# Patient Record
Sex: Male | Born: 1970 | Race: Black or African American | Hispanic: No | Marital: Married | Smoking: Former smoker
Health system: Southern US, Community
[De-identification: ages and names within clinical notes are randomized; demographics above are authoritative.]

## PROBLEM LIST (undated history)

## (undated) DIAGNOSIS — F319 Bipolar disorder, unspecified: Secondary | ICD-10-CM

## (undated) DIAGNOSIS — F32A Depression, unspecified: Secondary | ICD-10-CM

## (undated) DIAGNOSIS — F1010 Alcohol abuse, uncomplicated: Secondary | ICD-10-CM

## (undated) DIAGNOSIS — F329 Major depressive disorder, single episode, unspecified: Secondary | ICD-10-CM

## (undated) DIAGNOSIS — Z9289 Personal history of other medical treatment: Secondary | ICD-10-CM

## (undated) DIAGNOSIS — N529 Male erectile dysfunction, unspecified: Secondary | ICD-10-CM

## (undated) DIAGNOSIS — B001 Herpesviral vesicular dermatitis: Secondary | ICD-10-CM

## (undated) HISTORY — DX: Alcohol abuse, uncomplicated: F10.10

## (undated) HISTORY — DX: Herpesviral vesicular dermatitis: B00.1

## (undated) HISTORY — DX: Bipolar disorder, unspecified: F31.9

## (undated) HISTORY — PX: TENDON REPAIR: SHX5111

## (undated) HISTORY — DX: Male erectile dysfunction, unspecified: N52.9

## (undated) HISTORY — DX: Major depressive disorder, single episode, unspecified: F32.9

## (undated) HISTORY — DX: Personal history of other medical treatment: Z92.89

## (undated) HISTORY — DX: Depression, unspecified: F32.A

---

## 1999-02-09 ENCOUNTER — Emergency Department (HOSPITAL_COMMUNITY): Admission: EM | Admit: 1999-02-09 | Discharge: 1999-02-09 | Payer: Self-pay | Admitting: Emergency Medicine

## 1999-02-11 ENCOUNTER — Emergency Department (HOSPITAL_COMMUNITY): Admission: EM | Admit: 1999-02-11 | Discharge: 1999-02-11 | Payer: Self-pay | Admitting: Emergency Medicine

## 1999-10-02 ENCOUNTER — Emergency Department (HOSPITAL_COMMUNITY): Admission: EM | Admit: 1999-10-02 | Discharge: 1999-10-02 | Payer: Self-pay | Admitting: Emergency Medicine

## 2001-05-23 ENCOUNTER — Emergency Department (HOSPITAL_COMMUNITY): Admission: EM | Admit: 2001-05-23 | Discharge: 2001-05-23 | Payer: Self-pay | Admitting: Emergency Medicine

## 2001-09-20 ENCOUNTER — Encounter: Payer: Self-pay | Admitting: Family Medicine

## 2001-09-20 ENCOUNTER — Ambulatory Visit (HOSPITAL_COMMUNITY): Admission: RE | Admit: 2001-09-20 | Discharge: 2001-09-20 | Payer: Self-pay | Admitting: Family Medicine

## 2002-02-11 ENCOUNTER — Inpatient Hospital Stay (HOSPITAL_COMMUNITY): Admission: EM | Admit: 2002-02-11 | Discharge: 2002-02-17 | Payer: Self-pay | Admitting: Psychiatry

## 2003-05-20 ENCOUNTER — Encounter: Payer: Self-pay | Admitting: Emergency Medicine

## 2003-05-20 ENCOUNTER — Emergency Department (HOSPITAL_COMMUNITY): Admission: EM | Admit: 2003-05-20 | Discharge: 2003-05-20 | Payer: Self-pay | Admitting: Emergency Medicine

## 2003-08-01 ENCOUNTER — Inpatient Hospital Stay (HOSPITAL_COMMUNITY): Admission: EM | Admit: 2003-08-01 | Discharge: 2003-08-03 | Payer: Self-pay | Admitting: Psychiatry

## 2003-08-05 ENCOUNTER — Emergency Department (HOSPITAL_COMMUNITY): Admission: EM | Admit: 2003-08-05 | Discharge: 2003-08-05 | Payer: Self-pay | Admitting: Emergency Medicine

## 2004-08-19 ENCOUNTER — Emergency Department (HOSPITAL_COMMUNITY): Admission: EM | Admit: 2004-08-19 | Discharge: 2004-08-19 | Payer: Self-pay

## 2004-12-06 ENCOUNTER — Emergency Department (HOSPITAL_COMMUNITY): Admission: EM | Admit: 2004-12-06 | Discharge: 2004-12-06 | Payer: Self-pay | Admitting: Emergency Medicine

## 2006-07-05 ENCOUNTER — Emergency Department (HOSPITAL_COMMUNITY): Admission: EM | Admit: 2006-07-05 | Discharge: 2006-07-05 | Payer: Self-pay | Admitting: Emergency Medicine

## 2006-07-06 ENCOUNTER — Ambulatory Visit: Payer: Self-pay | Admitting: Internal Medicine

## 2006-07-11 ENCOUNTER — Encounter: Admission: RE | Admit: 2006-07-11 | Discharge: 2006-07-11 | Payer: Self-pay | Admitting: Internal Medicine

## 2006-11-09 ENCOUNTER — Emergency Department (HOSPITAL_COMMUNITY): Admission: EM | Admit: 2006-11-09 | Discharge: 2006-11-09 | Payer: Self-pay | Admitting: Emergency Medicine

## 2008-10-19 ENCOUNTER — Emergency Department (HOSPITAL_COMMUNITY): Admission: EM | Admit: 2008-10-19 | Discharge: 2008-10-19 | Payer: Self-pay | Admitting: Emergency Medicine

## 2011-03-20 NOTE — Discharge Summary (Signed)
NAME:  Andre Morgan, Andre Morgan NO.:  [id number]   MEDICAL RECORD NO.:  [id number]                   PATIENT TYPE:  IPS   LOCATION:  0305                                 FACILITY:  [facility]   PHYSICIAN:  Geoffery Lyons, M.D.                   DATE OF BIRTH:  06-Aug-1971   DATE OF ADMISSION:  08/01/2003  DATE OF DISCHARGE:  08/03/2003                                 DISCHARGE SUMMARY   CHIEF COMPLAINT AND PRESENT ILLNESS:  This was the second admission to [facility] for this 40 year old black male voluntarily  admitted who planned to kill himself.  He apparently quit his medications a  few months prior to his admission.  He felt that his children and fiance  would be better without him.  His brother had been missing in [place] since  July 2004, he just felt the depression getting worse.  He was referred by  Dr. Evelene Croon for intensive outpatient therapy.  Given the fact that he was  actively suicidal, inpatient treatment was recommended.   PAST PSYCHIATRIC HISTORY:  He was [facility] to [facility] on April 12 to  February 18, 2003.  He has had hospitalizations at [facility].  He has  history of previous attempts to hurt himself and not taking the Wellbutrin  and Lamictal for 2-3 months.   ALCOHOL AND DRUG HISTORY:  Denies active use of any substances.   PAST MEDICAL HISTORY:  Denies any history of any major medical conditions.   MEDICATIONS:  He was not taking any medications.   PHYSICAL EXAMINATION:  Performed and failed show any acute findings.   LABORATORY DATA:  CBC:  Hemoglobin 12.1.  Blood chemistry was within normal  limits.  Thyroid profile within normal limits.   MENTAL STATUS EXAM:  Initially upon admission he was not answering any  questions, was very irritable.  He walked out on the interviewer.  Mood  angry and irritable.  Affect irritable.  Depressed.  Not spontaneous.  Thoughts without spontaneous content.  Past history  of suicidal ideas, but  no active plans.  No homicidal ideas.  No delusions.  No hallucinations.  Cognition well preserved.   ADMISSION DIAGNOSIS:   AXIS I:  Major depression, recurrent.   AXIS II:  Deferred.   AXIS III:  No diagnosis.   AXIS IV:  Moderate.   AXIS V:  1. Global assessment of functioning upon admission:  45.  2. Highest global assessment of functioning in the last year:  65.   HOSPITAL COURSE:  He was admitted and started in intensive individual and  group psychotherapy.  He was put back on Wellbutrin XL 150 in the morning  and Lamictal in the morning.  He was given Ambien for sleep.  He was  initially in bed under the blanket.  No particular complaints.  Not sharing  much.  Upset, he claims, about the way he was treated when he was admitted.  On the second day, he was still guarded.  He would answer questions but not  look and tried to avoid eye contact.  Endorsed depression, suicidal  ruminations, feeling overwhelmed, hopeless, helpless.   On August 03, 2003, he was denying any suicidal ideation.  He still endorse  depression but he wanted to leave.  Back on the medication, he was willing  to maintain them, willing to come to [facility] IOP.  That was the  original plan, and he wanted to pursue it.  There was a meeting with the  patient and his fiance who reported no problem with the patient being  discharged, as she felt that he was on his baseline and he was comitted to  taking his medication.   DISCHARGE DIAGNOSIS:   AXIS I:  Major depression, recurrent.   AXIS II:  No diagnosis.   AXIS III:  No diagnosis.   AXIS IV:  Moderate.   AXIS V:  Global assessment of functioning on discharge:  50-55.   DISCHARGE MEDICATIONS:  1. Wellbutrin XL 150 mg daily for 7 days and then 300 mg daily.  2. Lamictal 25 one daily.  3. Ambien 10 at bedtime.   FOLLOW UP:  [facility].                                                Geoffery Lyons, M.D.    IL/MEDQ  D:  08/29/2003  T:  08/30/2003  Job:  [id number]

## 2011-03-20 NOTE — Discharge Summary (Signed)
[facility]  Patient:    Andre Morgan, MCCAULEY Visit Number: [id number] MRN: [id number]          Service Type: PSY Location: 400 [id number] 02 Attending Physician:  Jeanice Lim Dictated by:   Reymundo Poll Dub Mikes, M.D. Admit Date:  02/11/2002 Discharge Date: 02/17/2002                             Discharge Summary  CHIEF COMPLAINT AND HISTORY OF PRESENT ILLNESS:  This was the first admission to [facility] for this 40 year old male voluntarily admitted due to depression.  Had taken Imipramine and Prozac in the past, never more than two or three weeks and then quit.  Endorsed increased irritability, dysphoria for months, worse in the past two weeks.  Girlfriend told him he had to get help.  Decreased concentration, slept three to four hours per day.  Alcohol one case per day for the last two weeks, no withdrawal.  Weight loss.  Suicidal ideas on and off for two weeks; planned to shoot himself.  Denied active means.  PAST PSYCHIATRIC HISTORY:  First time at [facility]. History of overdose and cutting his wrists in the past.  SUBSTANCE ABUSE HISTORY:  Denies use of any other substances.  Has been using alcohol increasingly for the last two weeks.  MEDICATIONS ON ADMISSION:  No active medications.  PHYSICAL EXAMINATION:  GENERAL:  Performed and failed to show any acute findings.  MENTAL STATUS EXAMINATION ON ADMISSION:  Healthy appearing male, blunted affect, some psychomotor retardation, cooperative but poor eye contact. Speech: Soft, slow responses, increased response latency.  Mood: Depressed. Affect: Depressed.  Thought processes: Positive for suicidal ideas with plan; contracted for safety.  No auditory or visual hallucinations.  Thought content dealt with the events that led him to be admitted, his lack of coping. Cognitive: Well preserved.  ADMITTING DIAGNOSES: Axis I:    1. Major depression,  recurrent.            2. Alcohol abuse. Axis II:   No diagnosis. Axis III:  No diagnosis. Axis IV:   Moderate. Axis V:    Global assessment of functioning upon admission 30-35, highest            global assessment of functioning in the last year 55-60.  LABORATORY DATA:  CBC was within normal limits.  Blood chemistries were within normal limits.  Thyroid profile was within normal limits.  Drug screen was negative for substances of abuse.  HOSPITAL COURSE:  He was admitted and started in intensive individual and group psychotherapy.  He was started on Lexapro that was increased up to 10 mg per day.  He did admit to mood fluctuation, getting very upset, agitated, irritable, losing his temper.  He was agreeable to try a mood stabilizer so we started Depakote 250 mg twice a day as there were a lot of anger spells, irritability, many times no trigger.  He continued to experience depression, isolating, feeling overwhelmed, not knowing how to interact, very distrustful. On April 17, he was wanting to go home.  He felt he was ready to go.  The issues of abuse were discussed.  He was willing to give the medication a try and pursue further outpatient treatment.  Upon discharge, in full contact with reality, mood improved, affect brighter, tolerated the medication well.  Had worked on anger management, increased insight in terms of the  use of alcohol, willing and motivated to pursue outpatient treatment.  DISCHARGE DIAGNOSES: Axis I:    1. Major depression, recurrent.            2. Impulse control, not otherwise specified.            3. Alcohol abuse. Axis II:   No diagnosis. Axis III:  No diagnosis. Axis IV:   Moderate. Axis V:    Global assessment of functioning upon discharge 55.  DISCHARGE MEDICATIONS: 1. Depakote ER 500 mg at bedtime. 2. Lexapro 10 mg daily.  FOLLOWUP:  Dr. Milagros Evener and counselor for further evaluation and treatment.  Is going to abstain from the use of  alcohol. Dictated by:   Reymundo Poll Dub Mikes, M.D. Attending Physician:  Jeanice Lim DD:  03/29/02 TD:  03/31/02 Job: [id number]

## 2011-06-09 ENCOUNTER — Ambulatory Visit
Admission: RE | Admit: 2011-06-09 | Discharge: 2011-06-09 | Disposition: A | Discharge status: Home / Self Care | Payer: No Typology Code available for payment source | Source: Ambulatory Visit | Attending: Infectious Diseases | Admitting: Infectious Diseases

## 2011-06-09 ENCOUNTER — Other Ambulatory Visit: Payer: Self-pay | Admitting: Infectious Diseases

## 2011-06-09 DIAGNOSIS — R7611 Nonspecific reaction to tuberculin skin test without active tuberculosis: Secondary | ICD-10-CM

## 2011-11-04 ENCOUNTER — Telehealth: Payer: Self-pay

## 2011-11-04 DIAGNOSIS — Z0000 Encounter for general adult medical examination without abnormal findings: Secondary | ICD-10-CM

## 2011-11-04 NOTE — Telephone Encounter (Signed)
Put order in for physical labs. 

## 2011-12-03 ENCOUNTER — Other Ambulatory Visit (INDEPENDENT_AMBULATORY_CARE_PROVIDER_SITE_OTHER): Payer: No Typology Code available for payment source

## 2011-12-03 DIAGNOSIS — Z0000 Encounter for general adult medical examination without abnormal findings: Secondary | ICD-10-CM

## 2011-12-03 LAB — BASIC METABOLIC PANEL
CO2: 30 mEq/L (ref 19–32)
Calcium: 9 mg/dL (ref 8.4–10.5)
Creatinine, Ser: 1.3 mg/dL (ref 0.4–1.5)
GFR: 81.94 mL/min (ref >60.00)
Glucose, Bld: 91 mg/dL (ref 70–99)

## 2011-12-03 LAB — CBC WITH DIFFERENTIAL/PLATELET
Basophils Absolute: 0 10*3/uL (ref 0.0–0.1)
HCT: 38.2 % — ABNORMAL LOW (ref 39.0–52.0)
Hemoglobin: 12.2 g/dL — ABNORMAL LOW (ref 13.0–17.0)
Lymphocytes Relative: 31.4 % (ref 12.0–46.0)
Lymphs Abs: 1.5 10*3/uL (ref 0.7–4.0)
MCV: 73.1 fl — ABNORMAL LOW (ref 78.0–100.0)
Monocytes Absolute: 0.4 10*3/uL (ref 0.1–1.0)
Neutro Abs: 2.7 10*3/uL (ref 1.4–7.7)
Neutrophils Relative %: 57.9 % (ref 43.0–77.0)
RBC: 5.23 Mil/uL (ref 4.22–5.81)

## 2011-12-03 LAB — HEPATIC FUNCTION PANEL
ALT: 36 U/L (ref 0–53)
Albumin: 3.8 g/dL (ref 3.5–5.2)
Alkaline Phosphatase: 46 U/L (ref 39–117)
Bilirubin, Direct: 0.1 mg/dL (ref 0.0–0.3)
Total Bilirubin: 0.8 mg/dL (ref 0.3–1.2)
Total Protein: 7.2 g/dL (ref 6.0–8.3)

## 2011-12-03 LAB — LIPID PANEL: VLDL: 12.8 mg/dL (ref 0.0–40.0)

## 2011-12-04 LAB — URINALYSIS, ROUTINE W REFLEX MICROSCOPIC
Ketones, ur: NEGATIVE
Nitrite: NEGATIVE
Specific Gravity, Urine: 1.02 (ref 1.000–1.030)

## 2011-12-05 ENCOUNTER — Encounter: Payer: Self-pay | Admitting: Internal Medicine

## 2011-12-05 DIAGNOSIS — Z0000 Encounter for general adult medical examination without abnormal findings: Secondary | ICD-10-CM | POA: Insufficient documentation

## 2011-12-05 DIAGNOSIS — F1010 Alcohol abuse, uncomplicated: Secondary | ICD-10-CM

## 2011-12-05 DIAGNOSIS — Z0001 Encounter for general adult medical examination with abnormal findings: Secondary | ICD-10-CM | POA: Insufficient documentation

## 2011-12-05 DIAGNOSIS — F319 Bipolar disorder, unspecified: Secondary | ICD-10-CM

## 2011-12-05 HISTORY — DX: Bipolar disorder, unspecified: F31.9

## 2011-12-05 HISTORY — DX: Alcohol abuse, uncomplicated: F10.10

## 2011-12-08 ENCOUNTER — Encounter: Payer: Self-pay | Admitting: Internal Medicine

## 2011-12-08 DIAGNOSIS — Z9289 Personal history of other medical treatment: Secondary | ICD-10-CM

## 2011-12-08 HISTORY — DX: Personal history of other medical treatment: Z92.89

## 2011-12-09 ENCOUNTER — Other Ambulatory Visit (INDEPENDENT_AMBULATORY_CARE_PROVIDER_SITE_OTHER): Payer: No Typology Code available for payment source

## 2011-12-09 ENCOUNTER — Encounter: Payer: Self-pay | Admitting: Internal Medicine

## 2011-12-09 ENCOUNTER — Ambulatory Visit (INDEPENDENT_AMBULATORY_CARE_PROVIDER_SITE_OTHER): Payer: No Typology Code available for payment source | Admitting: Internal Medicine

## 2011-12-09 VITALS — BP 92/60 | HR 61 | Temp 98.6°F | Ht 71.0 in | Wt 218.0 lb

## 2011-12-09 DIAGNOSIS — B001 Herpesviral vesicular dermatitis: Secondary | ICD-10-CM

## 2011-12-09 DIAGNOSIS — Z0000 Encounter for general adult medical examination without abnormal findings: Secondary | ICD-10-CM

## 2011-12-09 DIAGNOSIS — M25561 Pain in right knee: Secondary | ICD-10-CM | POA: Insufficient documentation

## 2011-12-09 DIAGNOSIS — B009 Herpesviral infection, unspecified: Secondary | ICD-10-CM

## 2011-12-09 DIAGNOSIS — M25569 Pain in unspecified knee: Secondary | ICD-10-CM

## 2011-12-09 DIAGNOSIS — N529 Male erectile dysfunction, unspecified: Secondary | ICD-10-CM

## 2011-12-09 HISTORY — DX: Male erectile dysfunction, unspecified: N52.9

## 2011-12-09 HISTORY — DX: Herpesviral vesicular dermatitis: B00.1

## 2011-12-09 LAB — CBC WITH DIFFERENTIAL/PLATELET
Basophils Absolute: 0 10*3/uL (ref 0.0–0.1)
Eosinophils Relative: 1.7 % (ref 0.0–5.0)
HCT: 37 % — ABNORMAL LOW (ref 39.0–52.0)
Hemoglobin: 12 g/dL — ABNORMAL LOW (ref 13.0–17.0)
Lymphocytes Relative: 24.3 % (ref 12.0–46.0)
Lymphs Abs: 1.4 10*3/uL (ref 0.7–4.0)
Monocytes Absolute: 0.3 10*3/uL (ref 0.1–1.0)
Monocytes Relative: 6.2 % (ref 3.0–12.0)
Neutro Abs: 3.8 10*3/uL (ref 1.4–7.7)
Platelets: 193 10*3/uL (ref 150.0–400.0)

## 2011-12-09 LAB — URINALYSIS, ROUTINE W REFLEX MICROSCOPIC
Bilirubin Urine: NEGATIVE
Hgb urine dipstick: NEGATIVE
Ketones, ur: NEGATIVE
Leukocytes, UA: NEGATIVE
Nitrite: NEGATIVE
Specific Gravity, Urine: 1.015 (ref 1.000–1.030)
Total Protein, Urine: NEGATIVE
Urine Glucose: NEGATIVE
Urobilinogen, UA: 0.2 (ref 0.0–1.0)
pH: 7 (ref 5.0–8.0)

## 2011-12-09 LAB — BASIC METABOLIC PANEL
CO2: 30 mEq/L (ref 19–32)
Calcium: 8.9 mg/dL (ref 8.4–10.5)
Chloride: 106 mEq/L (ref 96–112)
GFR: 73.16 mL/min (ref >60.00)
Glucose, Bld: 82 mg/dL (ref 70–99)

## 2011-12-09 MED ORDER — VALACYCLOVIR HCL 1 G PO TABS: ORAL_TABLET | ORAL | Status: DC

## 2011-12-09 MED ORDER — VARDENAFIL HCL 20 MG PO TABS: 20.0000 mg | ORAL_TABLET | ORAL | Status: DC | PRN

## 2011-12-09 MED ORDER — MELOXICAM 15 MG PO TABS: 15.0000 mg | ORAL_TABLET | Freq: Every day | ORAL | Status: DC

## 2011-12-09 NOTE — Progress Notes (Signed)
Subjective:    Patient ID: Andre Morgan, male    DOB: 1970-12-24, 41 y.o.   MRN: [id number]  HPI  Here for wellness and f/u;  Overall doing ok;  Pt denies CP, worsening SOB, DOE, wheezing, orthopnea, PND, worsening LE edema, palpitations, dizziness or syncope.  Pt denies neurological change such as new Headache, facial or extremity weakness.  Pt denies polydipsia, polyuria, or low sugar symptoms. Pt states overall good compliance with treatment and medications, good tolerability, and trying to follow lower cholesterol diet.  Pt denies worsening depressive symptoms, suicidal ideation or panic. No fever, wt loss, night sweats, loss of appetite, or other constitutional symptoms.  Pt states good ability with ADL's, low fall risk, home safety reviewed and adequate, no significant changes in hearing or vision, and occasionally active with exercise.  Does have occasional right medial knee pain/aching mild occasionally, worse after standing more during the day.  Has mild worsening ED symptoms in the past 6 mo, and recurrent cold sores.   Past Medical History  Diagnosis Date  . Bipolar disorder 12/05/2011  . Alcohol abuse 12/05/2011  . 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 Depression   . Erectile dysfunction 12/09/2011  . 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  reports that he has quit smoking. He does not have any smokeless tobacco history on file. He reports that he drinks alcohol. He reports that he does not use illicit drugs. family history is not on file. No Known Allergies No current outpatient prescriptions on file prior to visit.   Review of Systems Review of Systems  Constitutional: Negative for diaphoresis, activity change, appetite change and unexpected weight change.  HENT: Negative for hearing loss, ear pain, facial swelling, mouth sores and neck stiffness.   Eyes: Negative for pain, redness and visual disturbance.  Respiratory: Negative for shortness of breath and  wheezing.   Cardiovascular: Negative for chest pain and palpitations.  Gastrointestinal: Negative for diarrhea, blood in stool, abdominal distention and rectal pain.  Genitourinary: Negative for hematuria, flank pain and decreased urine volume.  Musculoskeletal: Negative for myalgias and joint swelling.  Skin: Negative for color change and wound.  Neurological: Negative for syncope and numbness.  Hematological: Negative for adenopathy.  Psychiatric/Behavioral: Negative for hallucinations, self-injury, decreased concentration and agitation.  .      Objective:   Physical Exam BP 92/60  Pulse 61  Temp(Src) 98.6 F (37 C) (Oral)  Ht 5\' 11"  (1.803 m)  Wt 218 lb (98.884 kg)  BMI 30.40 kg/m2  SpO2 97% Physical Exam  VS noted Constitutional: Pt is oriented to person, place, and time. Appears well-developed and well-nourished.  HENT:  Head: Normocephalic and atraumatic.  Right Ear: External ear normal.  Left Ear: External ear normal.  Nose: Nose normal.  Mouth/Throat: Oropharynx is clear and moist.  Eyes: Conjunctivae and EOM are normal. Pupils are equal, round, and reactive to light.  Neck: Normal range of motion. Neck supple. No JVD present. No tracheal deviation present.  Cardiovascular: Normal rate, regular rhythm, normal heart sounds and intact distal pulses.   Pulmonary/Chest: Effort normal and breath sounds normal.  Abdominal: Soft. Bowel sounds are normal. There is no tenderness.  Musculoskeletal: Normal range of motion. Exhibits no edema.  Lymphadenopathy:  Has no cervical adenopathy.  Neurological: Pt is alert and oriented to person, place, and time. Pt has normal reflexes. No cranial nerve deficit.  Skin: Skin is warm and dry. No rash noted.  Psychiatric:  Has  normal mood and affect. Behavior is normal.  Right knee NT, no effusion, no crepitus    Assessment & Plan:

## 2011-12-09 NOTE — Patient Instructions (Signed)
Take all new medications as prescribed Continue all other medications as before Please go to LAB in the Basement for the blood and/or urine tests to be done today Please call the phone number [phone] (the PhoneTree System) for results of testing in 2-3 days;  When calling, simply dial the number, and when prompted enter the MRN number above (the Medical Record Number) and the # key, then the message should start. Please return in 1 year for your yearly visit, or sooner if needed, with Lab testing done 3-5 days before  

## 2011-12-10 LAB — HEPATIC FUNCTION PANEL
ALT: 35 U/L (ref 0–53)
AST: 36 U/L (ref 0–37)
Alkaline Phosphatase: 44 U/L (ref 39–117)
Bilirubin, Direct: 0.1 mg/dL (ref 0.0–0.3)
Total Bilirubin: 0.7 mg/dL (ref 0.3–1.2)
Total Protein: 6.9 g/dL (ref 6.0–8.3)

## 2011-12-12 ENCOUNTER — Encounter: Payer: Self-pay | Admitting: Internal Medicine

## 2011-12-12 NOTE — Assessment & Plan Note (Signed)
For levitra prn, to check testosterone check next visit

## 2011-12-12 NOTE — Assessment & Plan Note (Signed)

## 2011-12-12 NOTE — Assessment & Plan Note (Signed)
For valtrex prn 

## 2011-12-12 NOTE — Assessment & Plan Note (Signed)
Exam benign, for nsaid prn,  to f/u any worsening symptoms or concerns

## 2012-04-14 ENCOUNTER — Ambulatory Visit (INDEPENDENT_AMBULATORY_CARE_PROVIDER_SITE_OTHER): Payer: 59 | Admitting: Endocrinology

## 2012-04-14 DIAGNOSIS — Z0289 Encounter for other administrative examinations: Secondary | ICD-10-CM

## 2012-04-14 DIAGNOSIS — J069 Acute upper respiratory infection, unspecified: Secondary | ICD-10-CM

## 2012-04-16 ENCOUNTER — Encounter (HOSPITAL_COMMUNITY): Payer: Self-pay | Admitting: *Deleted

## 2012-04-16 ENCOUNTER — Emergency Department (HOSPITAL_COMMUNITY)
Admission: EM | Admit: 2012-04-16 | Discharge: 2012-04-16 | Disposition: A | Discharge status: Home / Self Care | Payer: 59 | Source: Home / Self Care | Attending: Emergency Medicine | Admitting: Emergency Medicine

## 2012-04-16 DIAGNOSIS — K047 Periapical abscess without sinus: Secondary | ICD-10-CM

## 2012-04-16 MED ORDER — DICLOFENAC SODIUM 75 MG PO TBEC: 75.0000 mg | DELAYED_RELEASE_TABLET | Freq: Two times a day (BID) | ORAL | Status: AC

## 2012-04-16 MED ORDER — PENICILLIN V POTASSIUM 500 MG PO TABS: 500.0000 mg | ORAL_TABLET | Freq: Four times a day (QID) | ORAL | Status: AC

## 2012-04-16 MED ORDER — HYDROCODONE-ACETAMINOPHEN 5-325 MG PO TABS: ORAL_TABLET | ORAL | Status: AC

## 2012-04-16 NOTE — ED Notes (Signed)
Pt with onset of sore throat x 3 days son and daughter both with strep - pt also with c/o toothache left upper onset x months worse with sore throat

## 2012-04-16 NOTE — Discharge Instructions (Signed)
Look up the [employer]'s [facility] for free dental clinics. Http://www.ncdental.org/ncds/Schedule.asp ° °Get there early and be prepared to wait. [employer] and [employer] have dental hygienist schools that provide low cost routine dental care.  ° °Other resources: °[facility] °[address] °[place], [place] °[phone] ° °Patients with Medicaid: °[facility]                      Dental °[address]                                [address] °Phone:  [phone]                                                  Phone:  [phone] ° °If unable to pay or uninsured, contact:  [facility] or [facility] to become qualified for the adult dental clinic. ° °No matter what dental problem you have, it will not get better unless you get good dental care.  If the tooth is not taken care of, your symptoms will come back in time and you will be visiting us again in the Urgent Care Center with a bad toothache.  So, see your dentist as soon as possible.  If you don't have a dentist, we can give you a list of dentists.  Sometimes the most cost effective treatment is removal of the tooth.  This can be done very inexpensively through one of the low cost [facility] Centers such as the facility on [address] in [place] ([phone]).  The downside to this is that you will have one less tooth and this can effect your ability to chew. ° °Some other things that can be done for a dental infection include the following: ° °· Rinse your mouth out with hot salt water (1/2 tsp of table salt and a pinch of baking soda in 8 oz of hot water).  You can do this every 2 or 3 hours. °· Avoid cold foods, beverages, and cold air.  This will make your symptoms worse. °· Sleep with your head elevated.  Sleeping flat will cause your gums and oral tissues to swell and make them hurt more.  You can sleep on several pillows.  Even  better is to sleep in a recliner with your head higher than your heart. °· For mild to moderate pain, you can take Tylenol, ibuprofen, or Aleve. °· External application of heat by a heating pad, hot water bottle, or hot wet towel can help with pain and speed healing.  You can do this every 2 to 3 hours. Do not fall asleep on a heating pad since this can cause a burn.  ° °Go to www.goodrx.com to look up your medications. This will give you a list of where you can find your prescriptions at the most affordable prices.  ° °RESOURCE GUIDE ° °Dental Problems ° °Look up http://www.ncdental.org/ncds/Schedule.asp for a schedule of the [employer]'s free dental clinics called [place] [facility]. They have clinics all around [place]. Get there early and be prepared to wait.  ° °[facility] °[address]  [place], [place] [address] °[phone] ° °[facility] °  [address] °[place], [place] °[phone] ° °Patients with Medicaid: °[facility]                     [facility] °[address]                                [address] °Phone:  [phone]                                                  Phone:  [phone] ° °If unable to pay or uninsured, contact:  [facility] or [facility] to become qualified for the adult dental clinic. ° °

## 2012-04-16 NOTE — ED Provider Notes (Signed)
Chief Complaint  Patient presents with  . Sore Throat  . Dental Pain    History of Present Illness:   The patient has had a three-day history of sore throat, pain on swallowing, and an abscessed tooth in his left upper dentition. Actually all 3 of his molars are abscessed. The first 2 molars have been abscessed for years and the third is just going to get abscess. There is swelling of the gingiva, pus drainage, and pain with chewing. He denies any difficulty breathing. He has pain on swallowing. He notes headache and pain in his left face and neck. He denies any fever or chills, coughing, wheezing, or shortness of breath.  Review of Systems:  Other than noted above, the patient denies any of the following symptoms: Systemic:  No fever, chills, sweats or weight loss. ENT:  No headache, ear ache, sore throat, nasal congestion, facial pain, or swelling. Lymphatic:  No adenopathy. Lungs:  No coughing, wheezing or shortness of breath.  PMFSH:  Past medical history, family history, social history, meds, and allergies were reviewed.  Physical Exam:   Vital signs:  BP 113/70  Pulse 56  Temp 97.6 F (36.4 C) (Oral)  Resp 18  SpO2 100% General:  Alert, oriented, in no distress. ENT:  TMs and canals normal.  Nasal mucosa normal. Mouth exam:  There is widespread dental decay. The last 3 molars on his left upper dentition were decayed, the first 2 were decayed down to the gumline and the third was partially decayed. There was some swelling of the gingiva. The pharynx looks clear without any swelling or redness. There is no swelling of the floor the mouth. Neck:  No swelling or adenopathy. Lungs:  Breath sounds clear and equal bilaterally.  No wheezes, rales or rhonchi. Heart:  Regular rhythm.  No gallops or murmers. Skin:  Clear, warm and dry.  Assessment:  The encounter diagnosis was Dental abscess.  Plan:   1.  The following meds were prescribed:   New Prescriptions   DICLOFENAC (VOLTAREN)  75 MG EC TABLET    Take 1 tablet (75 mg total) by mouth 2 (two) times daily.   HYDROCODONE-ACETAMINOPHEN (NORCO) 5-325 MG PER TABLET    1 to 2 tabs every 4 to 6 hours as needed for pain.   PENICILLIN V POTASSIUM (VEETID) 500 MG TABLET    Take 1 tablet (500 mg total) by mouth 4 (four) times daily.   2.  The patient was instructed in symptomatic care and handouts were given. 3.  The patient was told to return if becoming worse in any way, if no better in 3 or 4 days, and given some red flag symptoms that would indicate earlier return, especially difficulty breathing. 4.  The patient was told to follow up with a dentist as soon as possible.    Reuben Likes, MD 04/16/12 2220

## 2012-04-17 NOTE — Progress Notes (Signed)
  Subjective:    Patient ID: Andre Morgan, male    DOB: Oct 05, 1971, 41 y.o.   MRN: [id number]  HPI No show  Review of Systems     Objective:   Physical Exam        Assessment & Plan:

## 2012-07-18 ENCOUNTER — Encounter (HOSPITAL_COMMUNITY): Payer: Self-pay | Admitting: *Deleted

## 2012-07-18 ENCOUNTER — Emergency Department (INDEPENDENT_AMBULATORY_CARE_PROVIDER_SITE_OTHER): Payer: 59

## 2012-07-18 ENCOUNTER — Emergency Department (HOSPITAL_COMMUNITY)
Admission: EM | Admit: 2012-07-18 | Discharge: 2012-07-18 | Disposition: A | Discharge status: Home / Self Care | Payer: 59 | Source: Home / Self Care | Attending: Family Medicine | Admitting: Family Medicine

## 2012-07-18 DIAGNOSIS — M25569 Pain in unspecified knee: Secondary | ICD-10-CM

## 2012-07-18 DIAGNOSIS — M25529 Pain in unspecified elbow: Secondary | ICD-10-CM

## 2012-07-18 DIAGNOSIS — M25561 Pain in right knee: Secondary | ICD-10-CM

## 2012-07-18 DIAGNOSIS — M7710 Lateral epicondylitis, unspecified elbow: Secondary | ICD-10-CM

## 2012-07-18 MED ORDER — MELOXICAM 15 MG PO TABS: 15.0000 mg | ORAL_TABLET | Freq: Every day | ORAL | Status: AC

## 2012-07-18 NOTE — ED Notes (Signed)
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  Having difficulty grabbing large object with left hand, elbow is very stiff after sleeping.  Has been taking IBU, applying ice and heat without relief.

## 2012-07-18 NOTE — ED Provider Notes (Signed)
History     CSN: [id number]  Arrival date & time 07/18/12  1720   None     Chief Complaint  Patient presents with  . Elbow Pain    (Consider location/radiation/quality/duration/timing/severity/associated sxs/prior treatment) The history is provided by the patient.  Andre Morgan is a 41 y.o. male who complains of left elbow pain for 3 days, increasingly worse after CPR training.  Mechanism of injury: no known, avidly lifts weights. symptoms have been increasingly worse since that time, worse with movement. Has taken ibuprofen and applied heat with no relief in symptoms.  No prior history of related problems.   Past Medical History  Diagnosis Date  . Bipolar disorder 12/05/2011  . Alcohol abuse 12/05/2011  . 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 Depression   . Erectile dysfunction 12/09/2011  . 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 No pertinent past surgical history.  No family history on file.  History  Substance Use Topics  . Smoking status: Former Games developer  . Smokeless tobacco: Not on file  . Alcohol Use: No      Review of Systems  Constitutional: Negative.   Respiratory: Negative.   Cardiovascular: Negative.   Musculoskeletal: Positive for arthralgias. Negative for myalgias, back pain, joint swelling and gait problem.    Allergies  Review of patient's allergies indicates no known allergies.  Home Medications   Current Outpatient Rx  Name Route Sig Dispense Refill  . VALACYCLOVIR HCL 1 G PO TABS  1 tab by mouth twice per day as needed for cold sores 10 tablet 5  . DICLOFENAC SODIUM 75 MG PO TBEC Oral Take 1 tablet (75 mg total) by mouth 2 (two) times daily. 20 tablet 0  . MELOXICAM 15 MG PO TABS Oral Take 1 tablet (15 mg total) by mouth daily. As needed for pain 30 tablet 1  . VARDENAFIL HCL 20 MG PO TABS Oral Take 1 tablet (20 mg total) by mouth as needed for erectile dysfunction. 10 tablet 5    BP 128/80  Pulse 50  Temp 98 F (36.7 C) (Oral)   Resp 18  SpO2 100%  Physical Exam  Nursing note and vitals reviewed. Constitutional: He is oriented to person, place, and time. Vital signs are normal. He appears well-developed and well-nourished. He is active and cooperative.  HENT:  Head: Normocephalic.  Eyes: Conjunctivae normal are normal. Pupils are equal, round, and reactive to light. No scleral icterus.  Neck: Trachea normal. Neck supple.  Cardiovascular: Normal rate, regular rhythm and normal heart sounds.   Pulmonary/Chest: Effort normal and breath sounds normal.  Musculoskeletal:       Right elbow: Normal.      Left elbow: He exhibits normal range of motion, no swelling, no effusion and no deformity. tenderness found. Lateral epicondyle tenderness noted. No radial head, no medial epicondyle and no olecranon process tenderness noted.  Neurological: He is alert and oriented to person, place, and time. He has normal strength and normal reflexes. No cranial nerve deficit or sensory deficit. GCS eye subscore is 4. GCS verbal subscore is 5. GCS motor subscore is 6.  Skin: Skin is warm and dry. No rash noted.  Psychiatric: He has a normal mood and affect. His speech is normal and behavior is normal. Judgment and thought content normal. Cognition and memory are normal.    ED Course  Procedures (including critical care time)  Labs Reviewed - No data to display Dg Elbow Complete Left  07/18/2012  *  RADIOLOGY REPORT*  Clinical Data: Left lateral elbow pain.  LEFT ELBOW - COMPLETE 3+ VIEW  Comparison:  None.  Findings:  There is no evidence of fracture, dislocation, or joint effusion.  There is no evidence of arthropathy or other focal bone abnormality.  Soft tissues are unremarkable.  IMPRESSION: Negative.   Original Report Authenticated By: Elsie Stain, M.D.      1. Lateral epicondylitis   2. Elbow pain   3. Right knee pain       MDM  NSAIDS, compression with ace or elbow sleeve.  Follow up with orthopedist if symptoms are not  improved.  No sports for one week.       Johnsie Kindred, NP 07/18/12 1949

## 2012-07-19 NOTE — ED Provider Notes (Signed)
Medical screening examination/treatment/procedure(s) were performed by resident physician or non-physician practitioner and as supervising physician I was immediately available for consultation/collaboration.   Aliah Eriksson DOUGLAS MD.    Dennard Vezina D Kymberlyn Eckford, MD 07/19/12 1912 

## 2012-08-18 ENCOUNTER — Encounter: Payer: Self-pay | Admitting: Internal Medicine

## 2012-08-18 ENCOUNTER — Ambulatory Visit (INDEPENDENT_AMBULATORY_CARE_PROVIDER_SITE_OTHER): Payer: 59 | Admitting: Internal Medicine

## 2012-08-18 VITALS — BP 94/62 | HR 52 | Temp 97.4°F | Ht 71.0 in | Wt 203.0 lb

## 2012-08-18 DIAGNOSIS — F319 Bipolar disorder, unspecified: Secondary | ICD-10-CM

## 2012-08-18 DIAGNOSIS — M7710 Lateral epicondylitis, unspecified elbow: Secondary | ICD-10-CM

## 2012-08-18 DIAGNOSIS — M5412 Radiculopathy, cervical region: Secondary | ICD-10-CM

## 2012-08-18 DIAGNOSIS — M7712 Lateral epicondylitis, left elbow: Secondary | ICD-10-CM

## 2012-08-18 DIAGNOSIS — G5620 Lesion of ulnar nerve, unspecified upper limb: Secondary | ICD-10-CM

## 2012-08-18 MED ORDER — PREDNISONE 10 MG PO TABS: ORAL_TABLET | ORAL | Status: DC

## 2012-08-18 NOTE — Patient Instructions (Addendum)
Take all new medications as prescribed  - the prednisone Continue all other medications as before Please call inf 1-2 wks if not improved; you might need orthopedic referral for the tendonitis, or if the numbness/pain is worse or if you have left hand weakness, you would need neurosurgury referral Thank you for enrolling in MyChart. Please follow the instructions below to securely access your online medical record. MyChart allows you to send messages to your doctor, view your test results, renew your prescriptions, schedule appointments, and more. Remember your username is [email or web address]

## 2012-08-20 ENCOUNTER — Encounter: Payer: Self-pay | Admitting: Internal Medicine

## 2012-08-20 DIAGNOSIS — M7712 Lateral epicondylitis, left elbow: Secondary | ICD-10-CM | POA: Insufficient documentation

## 2012-08-20 DIAGNOSIS — G5620 Lesion of ulnar nerve, unspecified upper limb: Secondary | ICD-10-CM | POA: Insufficient documentation

## 2012-08-20 NOTE — Assessment & Plan Note (Signed)
stable overall by hx and exam, most recent data reviewed with pt, and pt to continue medical treatment as before Lab Results  Component Value Date   WBC 5.6 12/09/2011   HGB 12.0* 12/09/2011   HCT 37.0* 12/09/2011   PLT 193.0 12/09/2011   GLUCOSE 82 12/09/2011   CHOL 173 12/09/2011   TRIG 62.0 12/09/2011   HDL 61.90 12/09/2011   LDLCALC 99 12/09/2011   ALT 35 12/09/2011   AST 36 12/09/2011   NA 140 12/09/2011   K 3.8 12/09/2011   CL 106 12/09/2011   CREATININE 1.4 12/09/2011   BUN 13 12/09/2011   CO2 30 12/09/2011   TSH 3.87 12/09/2011

## 2012-08-20 NOTE — Progress Notes (Signed)
Subjective:    Patient ID: Andre Morgan, male    DOB: 03-Dec-1970, 41 y.o.   MRN: [id number]  HPI  Here with ongoing pain to the left elbow area for over a month, now worse despite seeing UC, dx and tx for left lateral epicondylitis, but now worse despite holding off on going to gym further and avoiding wt lifting, and doesn't do other signficant repetitive motions at work, no recent trauma;  Overall tender area persists now moderate, occas severe, nothing makes better or worse but has some burnging numbness as well near the elbow next to the epicondylar area with radiation to the hand;  No hand or arm weakness,  No upper arm/shoulder/neck symptoms.  Pt denies chest pain, increased sob or doe, wheezing, orthopnea, PND, increased LE swelling, palpitations, dizziness or syncope.   Pt denies polydipsia, polyuria.  Pt denies new neurological symptoms such as new headache, or facial or extremity weakness or numbness except for the above. Past Medical History  Diagnosis Date  . Bipolar disorder 12/05/2011  . Alcohol abuse 12/05/2011  . 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 Depression   . Erectile dysfunction 12/09/2011  . 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  reports that he has quit smoking. He does not have any smokeless tobacco history on file. He reports that he does not drink alcohol or use illicit drugs. family history is not on file. No Known Allergies Current Outpatient Prescriptions on File Prior to Visit  Medication Sig Dispense Refill  . diclofenac (VOLTAREN) 75 MG EC tablet Take 1 tablet (75 mg total) by mouth 2 (two) times daily.  20 tablet  0  . meloxicam (MOBIC) 15 MG tablet Take 1 tablet (15 mg total) by mouth daily. As needed for pain  30 tablet  1  . valACYclovir (VALTREX) 1000 MG tablet 1 tab by mouth twice per day as needed for cold sores  10 tablet  5  . vardenafil (LEVITRA) 20 MG tablet Take 1 tablet (20 mg total) by mouth as needed for erectile  dysfunction.  10 tablet  5   Review of Systems  Constitutional: Negative for diaphoresis and unexpected weight change.  HENT: Negative for tinnitus.   Eyes: Negative for photophobia and visual disturbance.  Respiratory: Negative for choking and stridor.   Gastrointestinal: Negative for vomiting and blood in stool.  Genitourinary: Negative for hematuria and decreased urine volume.  Musculoskeletal: Negative for gait problem.  Skin: Negative for color change and wound.  Neurological: Negative for tremors and numbness. Except for the above Psychiatric/Behavioral: Negative for decreased concentration. The patient is not hyperactive.      Objective:   Physical Exam BP 94/62  Pulse 52  Temp 97.4 F (36.3 C) (Oral)  Ht 5\' 11"  (1.803 m)  Wt 203 lb (92.08 kg)  BMI 28.31 kg/m2  SpO2 98% Physical Exam  VS noted Constitutional: Pt appears well-developed and well-nourished.  HENT: Head: Normocephalic.  Right Ear: External ear normal.  Left Ear: External ear normal.  Eyes: Conjunctivae and EOM are normal. Pupils are equal, round, and reactive to light.  Neck: Normal range of motion. Neck supple.  Cardiovascular: Normal rate and regular rhythm.   Pulmonary/Chest: Effort normal and breath sounds normal.  Abd:  Soft, NT, non-distended, + BS Neurological: Pt is alert. Not confused , motor/sens/dtr intact to UE;s, grip strength intact, no rash Skin: Skin is warm. No erythema.  Psychiatric: Pt behavior is normal. Thought content normal.  Assessment & Plan:

## 2012-08-20 NOTE — Assessment & Plan Note (Signed)
Mild left, for predpack asd, to cont holding off on wt lifting, consider neurrosurg eval or emg/ncs

## 2012-08-20 NOTE — Assessment & Plan Note (Signed)
Mild to mod, for nsaid prn,  to f/u any worsening symptoms or concerns 

## 2012-09-26 ENCOUNTER — Telehealth: Payer: Self-pay | Admitting: Internal Medicine

## 2012-09-26 DIAGNOSIS — M25522 Pain in left elbow: Secondary | ICD-10-CM

## 2012-09-26 NOTE — Telephone Encounter (Signed)
Done per emr 

## 2012-09-26 NOTE — Telephone Encounter (Signed)
Pt wants a referral to Ortho.

## 2012-12-17 ENCOUNTER — Other Ambulatory Visit: Payer: Self-pay

## 2013-01-28 ENCOUNTER — Other Ambulatory Visit: Payer: Self-pay | Admitting: Internal Medicine

## 2013-03-02 ENCOUNTER — Other Ambulatory Visit (HOSPITAL_COMMUNITY): Payer: Self-pay | Admitting: Orthopedic Surgery

## 2013-03-02 DIAGNOSIS — M25522 Pain in left elbow: Secondary | ICD-10-CM

## 2013-03-03 ENCOUNTER — Ambulatory Visit (HOSPITAL_COMMUNITY)
Admission: RE | Admit: 2013-03-03 | Discharge: 2013-03-03 | Disposition: A | Discharge status: Home / Self Care | Payer: 59 | Source: Ambulatory Visit | Attending: Orthopedic Surgery | Admitting: Orthopedic Surgery

## 2013-03-03 DIAGNOSIS — M25522 Pain in left elbow: Secondary | ICD-10-CM

## 2013-03-03 DIAGNOSIS — M7710 Lateral epicondylitis, unspecified elbow: Secondary | ICD-10-CM | POA: Insufficient documentation

## 2013-04-03 ENCOUNTER — Encounter: Payer: Self-pay | Admitting: Internal Medicine

## 2013-08-17 ENCOUNTER — Encounter: Payer: Self-pay | Admitting: Internal Medicine

## 2013-08-17 ENCOUNTER — Ambulatory Visit (INDEPENDENT_AMBULATORY_CARE_PROVIDER_SITE_OTHER): Payer: 59 | Admitting: Internal Medicine

## 2013-08-17 ENCOUNTER — Other Ambulatory Visit (INDEPENDENT_AMBULATORY_CARE_PROVIDER_SITE_OTHER): Payer: 59

## 2013-08-17 ENCOUNTER — Other Ambulatory Visit: Payer: Self-pay | Admitting: Internal Medicine

## 2013-08-17 VITALS — BP 104/76 | HR 88 | Temp 98.1°F | Ht 71.0 in | Wt 204.2 lb

## 2013-08-17 DIAGNOSIS — R7309 Other abnormal glucose: Secondary | ICD-10-CM

## 2013-08-17 DIAGNOSIS — Z0000 Encounter for general adult medical examination without abnormal findings: Secondary | ICD-10-CM

## 2013-08-17 DIAGNOSIS — R945 Abnormal results of liver function studies: Secondary | ICD-10-CM

## 2013-08-17 DIAGNOSIS — F259 Schizoaffective disorder, unspecified: Secondary | ICD-10-CM

## 2013-08-17 LAB — BASIC METABOLIC PANEL
BUN: 11 mg/dL (ref 6–23)
Calcium: 9.2 mg/dL (ref 8.4–10.5)
Creatinine, Ser: 1.2 mg/dL (ref 0.4–1.5)
GFR: 89.37 mL/min (ref >60.00)
Potassium: 3.7 mEq/L (ref 3.5–5.1)

## 2013-08-17 LAB — CBC WITH DIFFERENTIAL/PLATELET
Basophils Relative: 0.1 % (ref 0.0–3.0)
Eosinophils Relative: 1.8 % (ref 0.0–5.0)
HCT: 39.4 % (ref 39.0–52.0)
Hemoglobin: 12.6 g/dL — ABNORMAL LOW (ref 13.0–17.0)
Lymphs Abs: 1 10*3/uL (ref 0.7–4.0)
MCV: 71.1 fl — ABNORMAL LOW (ref 78.0–100.0)
Monocytes Absolute: 0.4 10*3/uL (ref 0.1–1.0)
RBC: 5.54 Mil/uL (ref 4.22–5.81)
WBC: 5.8 10*3/uL (ref 4.5–10.5)

## 2013-08-17 LAB — URINALYSIS, ROUTINE W REFLEX MICROSCOPIC
Bilirubin Urine: NEGATIVE
Ketones, ur: NEGATIVE
Nitrite: NEGATIVE
Specific Gravity, Urine: 1.02 (ref 1.000–1.030)
Total Protein, Urine: NEGATIVE
pH: 6.5 (ref 5.0–8.0)

## 2013-08-17 LAB — LIPID PANEL
Cholesterol: 183 mg/dL (ref 0–200)
LDL Cholesterol: 93 mg/dL (ref 0–99)
VLDL: 14 mg/dL (ref 0.0–40.0)

## 2013-08-17 LAB — PSA: PSA: 0.77 ng/mL (ref 0.10–4.00)

## 2013-08-17 LAB — HEPATIC FUNCTION PANEL: Total Bilirubin: 0.5 mg/dL (ref 0.3–1.2)

## 2013-08-17 MED ORDER — OLANZAPINE 5 MG PO TABS: 5.0000 mg | ORAL_TABLET | Freq: Every day | ORAL | Status: DC

## 2013-08-17 MED ORDER — LITHIUM CARBONATE 150 MG PO CAPS: 150.0000 mg | ORAL_CAPSULE | Freq: Three times a day (TID) | ORAL | Status: DC

## 2013-08-17 NOTE — Progress Notes (Signed)
Subjective:    Patient ID: Andre Morgan, male    DOB: October 07, 1971, 42 y.o.   MRN: [id number]  HPI  Here for wellness and f/u;  Overall doing ok;  Pt denies CP, worsening SOB, DOE, wheezing, orthopnea, PND, worsening LE edema, palpitations, dizziness or syncope.  Pt denies neurological change such as new headache, facial or extremity weakness.  Pt denies polydipsia, polyuria, or low sugar symptoms. Pt states overall good compliance with treatment and medications, good tolerability, and has been trying to follow lower cholesterol diet.  Pt denies worsening depressive symptoms, suicidal ideation or panic. No fever, night sweats, wt loss, loss of appetite, or other constitutional symptoms.  Pt states good ability with ADL's, has low fall risk, home safety reviewed and adequate, no other significant changes in hearing or vision, and only occasionally active with exercise.  Just discharged from [facility] related behavioral health last wk, now on lithium and what sounds like zyprexa, asked to f/u here for a1c > 6, though he is not sure of exact value. Past Medical History  Diagnosis Date  . Bipolar disorder 12/05/2011  . Alcohol abuse 12/05/2011  . 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 Depression   . Erectile dysfunction 12/09/2011  . 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  reports that he has quit smoking. He does not have any smokeless tobacco history on file. He reports that he does not drink alcohol or use illicit drugs. family history is not on file. No Known Allergies Current Outpatient Prescriptions on File Prior to Visit  Medication Sig Dispense Refill  . vardenafil (LEVITRA) 20 MG tablet Take 1 tablet (20 mg total) by mouth as needed for erectile dysfunction.  10 tablet  5   No current facility-administered medications on file prior to visit.   Review of Systems Constitutional: Negative for diaphoresis, activity change, appetite change or unexpected weight change.  HENT:  Negative for hearing loss, ear pain, facial swelling, mouth sores and neck stiffness.   Eyes: Negative for pain, redness and visual disturbance.  Respiratory: Negative for shortness of breath and wheezing.   Cardiovascular: Negative for chest pain and palpitations.  Gastrointestinal: Negative for diarrhea, blood in stool, abdominal distention or other pain Genitourinary: Negative for hematuria, flank pain or change in urine volume.  Musculoskeletal: Negative for myalgias and joint swelling.  Skin: Negative for color change and wound.  Neurological: Negative for syncope and numbness. other than noted Hematological: Negative for adenopathy.  Psychiatric/Behavioral: Negative for hallucinations, self-injury, decreased concentration and agitation.      Objective:   Physical Exam BP 104/76  Pulse 88  Temp(Src) 98.1 F (36.7 C) (Oral)  Ht 5\' 11"  (1.803 m)  Wt 204 lb 4 oz (92.647 kg)  BMI 28.5 kg/m2  SpO2 95% VS noted,  Constitutional: Pt is oriented to person, place, and time. Appears well-developed and well-nourished.  Head: Normocephalic and atraumatic.  Right Ear: External ear normal.  Left Ear: External ear normal.  Nose: Nose normal.  Mouth/Throat: Oropharynx is clear and moist.  Eyes: Conjunctivae and EOM are normal. Pupils are equal, round, and reactive to light.  Neck: Normal range of motion. Neck supple. No JVD present. No tracheal deviation present.  Cardiovascular: Normal rate, regular rhythm, normal heart sounds and intact distal pulses.   Pulmonary/Chest: Effort normal and breath sounds normal.  Abdominal: Soft. Bowel sounds are normal. There is no tenderness. No HSM  Musculoskeletal: Normal range of motion. Exhibits no edema.  Lymphadenopathy:  Has no cervical adenopathy.  Neurological: Pt is alert and oriented to person, place, and time. Pt has normal reflexes. No cranial nerve deficit.  Skin: Skin is warm and dry. No rash noted.  Psychiatric:  Has  normal mood and  affect. Behavior is normal.     Assessment & Plan:

## 2013-08-17 NOTE — Assessment & Plan Note (Signed)
For f/u psychiatry as planned 

## 2013-08-17 NOTE — Patient Instructions (Signed)
Please continue all other medications as before, and refills have been done if requested. Please have the pharmacy call with any other refills you may need. Please go to the LAB in the Basement (turn left off the elevator) for the tests to be done today You will be contacted by phone if any changes need to be made immediately.  Otherwise, you will receive a letter about your results with an explanation, but please check with MyChart first.  Please remember to sign up for My Chart if you have not done so, as this will be important to you in the future with finding out test results, communicating by private email, and scheduling acute appointments online when needed.  Please return in 6 months, or sooner if needed

## 2013-08-17 NOTE — Assessment & Plan Note (Signed)

## 2013-08-17 NOTE — Assessment & Plan Note (Signed)
For a1c today  

## 2013-08-18 ENCOUNTER — Ambulatory Visit: Payer: 59

## 2013-08-18 DIAGNOSIS — R7989 Other specified abnormal findings of blood chemistry: Secondary | ICD-10-CM

## 2013-08-18 LAB — HEPATITIS PANEL, ACUTE: Hepatitis B Surface Ag: NEGATIVE

## 2013-08-22 ENCOUNTER — Ambulatory Visit
Admission: RE | Admit: 2013-08-22 | Discharge: 2013-08-22 | Disposition: A | Discharge status: Home / Self Care | Payer: 59 | Source: Ambulatory Visit | Attending: Internal Medicine | Admitting: Internal Medicine

## 2013-08-22 DIAGNOSIS — R945 Abnormal results of liver function studies: Secondary | ICD-10-CM

## 2013-08-29 ENCOUNTER — Telehealth: Payer: Self-pay | Admitting: Internal Medicine

## 2013-08-29 DIAGNOSIS — F319 Bipolar disorder, unspecified: Secondary | ICD-10-CM

## 2013-08-29 NOTE — Telephone Encounter (Signed)
Referral has been done

## 2013-08-29 NOTE — Telephone Encounter (Signed)
08/29/2013  Pt's wife called in asking for a referral to see a psychiatrist.  Pt had a FU on 08/17/13 with Dr. Jonny Ruiz but is  Requesting to see a a regular psychiatrist not psychologist.  Please advise.  Contact # [phone] Clydie Braun, pt's wife)

## 2013-09-07 ENCOUNTER — Other Ambulatory Visit: Payer: Self-pay

## 2013-10-09 ENCOUNTER — Encounter: Payer: Self-pay | Admitting: Internal Medicine

## 2013-11-17 ENCOUNTER — Ambulatory Visit: Payer: 59 | Admitting: Internal Medicine

## 2013-11-21 ENCOUNTER — Ambulatory Visit: Payer: 59 | Admitting: Internal Medicine

## 2013-11-21 DIAGNOSIS — Z0289 Encounter for other administrative examinations: Secondary | ICD-10-CM

## 2013-11-24 ENCOUNTER — Ambulatory Visit (INDEPENDENT_AMBULATORY_CARE_PROVIDER_SITE_OTHER): Payer: 59 | Admitting: Internal Medicine

## 2013-11-24 ENCOUNTER — Telehealth: Payer: Self-pay | Admitting: Internal Medicine

## 2013-11-24 ENCOUNTER — Encounter: Payer: Self-pay | Admitting: Internal Medicine

## 2013-11-24 VITALS — BP 102/62 | HR 73 | Temp 99.1°F | Ht 71.0 in | Wt 200.5 lb

## 2013-11-24 DIAGNOSIS — F259 Schizoaffective disorder, unspecified: Secondary | ICD-10-CM

## 2013-11-24 DIAGNOSIS — I499 Cardiac arrhythmia, unspecified: Secondary | ICD-10-CM | POA: Insufficient documentation

## 2013-11-24 DIAGNOSIS — B009 Herpesviral infection, unspecified: Secondary | ICD-10-CM

## 2013-11-24 DIAGNOSIS — F250 Schizoaffective disorder, bipolar type: Secondary | ICD-10-CM

## 2013-11-24 DIAGNOSIS — B001 Herpesviral vesicular dermatitis: Secondary | ICD-10-CM

## 2013-11-24 MED ORDER — LITHIUM CARBONATE 150 MG PO CAPS
150.0000 mg | ORAL_CAPSULE | Freq: Every day | ORAL | Status: DC
Start: 2013-11-24 — End: 2014-05-13

## 2013-11-24 MED ORDER — LITHIUM CARBONATE 150 MG PO CAPS: 150.0000 mg | ORAL_CAPSULE | Freq: Every day | ORAL | Status: DC

## 2013-11-24 MED ORDER — VALACYCLOVIR HCL 1 G PO TABS: 1000.0000 mg | ORAL_TABLET | Freq: Two times a day (BID) | ORAL | Status: DC

## 2013-11-24 MED ORDER — OLANZAPINE 20 MG PO TABS: 20.0000 mg | ORAL_TABLET | Freq: Every day | ORAL | Status: DC

## 2013-11-24 NOTE — Assessment & Plan Note (Signed)
Asks for refill med, will do

## 2013-11-24 NOTE — Patient Instructions (Signed)
Your EKG was ok today (normal rhythm); it is OK to continue to monitor yourself for any other symptoms such as racing or skipping beats, shortness of breath, dizziness or chest pain  Your medications were re-started today as you requested  Please continue all other medications as before, and refills have been done if requested, including the valtrex (generic) for the cold sores  You will be contacted regarding the referral for: psychiatry for medication follow up, as well as psychology for family counseling  Your forms will hopefully be ready early next week  Please return in Oct 2015, or sooner if needed

## 2013-11-24 NOTE — Progress Notes (Signed)
   Subjective:    Patient ID: Andre Morgan, male    DOB: 04/03/1971, 43 y.o.   MRN: [id number]  HPI here to f/u, was hospd psychiatirc in oct and nov l2014, stabilized on lithium and zyprexa, ran out a few wks ago, and needs bridge meds, and referral to psychiatry.  Also asks for psychology referral for family counseling as well.  Has done relatively well in past 3 wks off meds but willing to re-start.  Also states wife wanted him to mention she noticed an occas irreg heart beat while her head on his chest in the AM one days last wk, Pt denies chest pain, increased sob or doe, wheezing, orthopnea, PND, increased LE swelling, palpitations, dizziness or syncope.   Pt denies fever, wt loss, night sweats, loss of appetite, or other constitutional symptoms Past Medical History  Diagnosis Date  . Bipolar disorder 12/05/2011  . Alcohol abuse 12/05/2011  . 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 Depression   . Erectile dysfunction 12/09/2011  . 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  reports that he has quit smoking. He does not have any smokeless tobacco history on file. He reports that he does not drink alcohol or use illicit drugs. family history is not on file. No Known Allergies Current Outpatient Prescriptions on File Prior to Visit  Medication Sig Dispense Refill  . vardenafil (LEVITRA) 20 MG tablet Take 1 tablet (20 mg total) by mouth as needed for erectile dysfunction.  10 tablet  5   No current facility-administered medications on file prior to visit.   Review of Systems  Constitutional: Negative for unexpected weight change, or unusual diaphoresis  HENT: Negative for tinnitus.   Eyes: Negative for photophobia and visual disturbance.  Respiratory: Negative for choking and stridor.   Gastrointestinal: Negative for vomiting and blood in stool.  Genitourinary: Negative for hematuria and decreased urine volume.  Musculoskeletal: Negative for acute joint swelling Skin:  Negative for color change and wound.  Neurological: Negative for tremors and numbness other than noted  Psychiatric/Behavioral: Negative for decreased concentration or  hyperactivity.       Objective:   Physical Exam BP 102/62  Pulse 73  Temp(Src) 99.1 F (37.3 C) (Oral)  Ht 5\' 11"  (1.803 m)  Wt 200 lb 8 oz (90.946 kg)  BMI 27.98 kg/m2  SpO2 98% VS noted,  Constitutional: Pt appears well-developed and well-nourished.  HENT: Head: NCAT.  Right Ear: External ear normal.  Left Ear: External ear normal.  Eyes: Conjunctivae and EOM are normal. Pupils are equal, round, and reactive to light.  Neck: Normal range of motion. Neck supple.  Cardiovascular: Normal rate and regular rhythm.   Pulmonary/Chest: Effort normal and breath sounds normal.  Neurological: Pt is alert. Not confused  Skin: Skin is warm. No erythema.  Psychiatric:  Thought content normal, not agitated today, no hallucinations    Assessment & Plan:

## 2013-11-24 NOTE — Telephone Encounter (Signed)
Patient informed prescriptions sent in.

## 2013-11-24 NOTE — Telephone Encounter (Signed)
Done per emr 

## 2013-11-24 NOTE — Telephone Encounter (Signed)
Pt forgot to ask Dr. Jonny RuizJohn to send all three med [facility]. Please send this ASAP, pt was hoping to pick this up today, forgot to ask while he was here for an appt today.

## 2013-11-24 NOTE — Assessment & Plan Note (Signed)
Suspect ectopy, ECG reviewed as per emr, ok to follow

## 2013-11-24 NOTE — Progress Notes (Signed)
Pre-visit discussion using our clinic review tool. No additional management support is needed unless otherwise documented below in the visit note.  

## 2013-11-24 NOTE — Assessment & Plan Note (Addendum)
Ok for re-start bridge meds, overall stable, for psychiatry referral, also for family counseling referral as well per pt request; has FMLA and other [facility] emplyment forms for similar purpose to fill out asap

## 2013-11-28 DIAGNOSIS — Z0279 Encounter for issue of other medical certificate: Secondary | ICD-10-CM

## 2013-12-17 ENCOUNTER — Other Ambulatory Visit: Payer: Self-pay | Admitting: Internal Medicine

## 2013-12-18 MED ORDER — OLANZAPINE 20 MG PO TABS: 20.0000 mg | ORAL_TABLET | Freq: Every day | ORAL | Status: DC

## 2014-02-16 ENCOUNTER — Ambulatory Visit (INDEPENDENT_AMBULATORY_CARE_PROVIDER_SITE_OTHER): Payer: 59 | Admitting: Internal Medicine

## 2014-02-16 ENCOUNTER — Other Ambulatory Visit (INDEPENDENT_AMBULATORY_CARE_PROVIDER_SITE_OTHER): Payer: 59

## 2014-02-16 ENCOUNTER — Encounter: Payer: Self-pay | Admitting: Internal Medicine

## 2014-02-16 VITALS — BP 126/64 | HR 56 | Wt 214.8 lb

## 2014-02-16 DIAGNOSIS — R7989 Other specified abnormal findings of blood chemistry: Secondary | ICD-10-CM

## 2014-02-16 DIAGNOSIS — M7710 Lateral epicondylitis, unspecified elbow: Secondary | ICD-10-CM

## 2014-02-16 DIAGNOSIS — M25511 Pain in right shoulder: Secondary | ICD-10-CM | POA: Insufficient documentation

## 2014-02-16 DIAGNOSIS — M7711 Lateral epicondylitis, right elbow: Secondary | ICD-10-CM | POA: Insufficient documentation

## 2014-02-16 DIAGNOSIS — R945 Abnormal results of liver function studies: Secondary | ICD-10-CM

## 2014-02-16 DIAGNOSIS — M25519 Pain in unspecified shoulder: Secondary | ICD-10-CM

## 2014-02-16 LAB — HEPATIC FUNCTION PANEL
ALBUMIN: 3.3 g/dL — AB (ref 3.5–5.2)
ALT: 53 U/L (ref 0–53)
AST: 49 U/L — ABNORMAL HIGH (ref 0–37)
Alkaline Phosphatase: 35 U/L — ABNORMAL LOW (ref 39–117)
BILIRUBIN DIRECT: 0.1 mg/dL (ref 0.0–0.3)
TOTAL PROTEIN: 6.2 g/dL (ref 6.0–8.3)
Total Bilirubin: 0.8 mg/dL (ref 0.3–1.2)

## 2014-02-16 NOTE — Assessment & Plan Note (Signed)
C/w impingement syndrome, for referral Dr Katrinka BlazingSmith, sport medicine

## 2014-02-16 NOTE — Progress Notes (Signed)
Pre visit review using our clinic review tool, if applicable. No additional management support is needed unless otherwise documented below in the visit note. 

## 2014-02-16 NOTE — Assessment & Plan Note (Signed)
Mild, tylenol prn, d/w pt-  to f/u any worsening symptoms or concerns

## 2014-02-16 NOTE — Progress Notes (Signed)
   Subjective:    Patient ID: Andre Morgan, male    DOB: 02/22/1971, 43 y.o.   MRN: [id number]  HPI  Here to f/u; overall doing ok,  Pt denies chest pain, increased sob or doe, wheezing, orthopnea, PND, increased LE swelling, palpitations, dizziness or syncope.  Pt denies polydipsia, polyuria, or low sugar symptoms such as weakness or confusion improved with po intake.  Pt denies new neurological symptoms such as new headache, or facial or extremity weakness or numbness.   Pt states overall good compliance with meds, has been trying to follow lower cholesterol, diabetic diet, with wt overall stable,  but little exercise however. States no recent ETOH use, cut back on supplements, still some wt lifting.  Has recurring right shoulder pain with abduction.   Past Medical History  Diagnosis Date  . Bipolar disorder 12/05/2011  . Alcohol abuse 12/05/2011  . 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 Depression   . Erectile dysfunction 12/09/2011  . 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  reports that he has quit smoking. He does not have any smokeless tobacco history on file. He reports that he does not drink alcohol or use illicit drugs. family history is not on file. No Known Allergies Current Outpatient Prescriptions on File Prior to Visit  Medication Sig Dispense Refill  . lithium carbonate 150 MG capsule Take 1 capsule (150 mg total) by mouth daily.  90 capsule  0  . OLANZapine (ZYPREXA) 20 MG tablet Take 1 tablet (20 mg total) by mouth at bedtime.  90 tablet  1  . valACYclovir (VALTREX) 1000 MG tablet Take 1 tablet (1,000 mg total) by mouth 2 (two) times daily. As needed for cold sores  14 tablet  2  . vardenafil (LEVITRA) 20 MG tablet Take 1 tablet (20 mg total) by mouth as needed for erectile dysfunction.  10 tablet  5   No current facility-administered medications on file prior to visit.   Review of Systems  Constitutional: Negative for unexpected weight change, or  unusual diaphoresis  HENT: Negative for tinnitus.   Eyes: Negative for photophobia and visual disturbance.  Respiratory: Negative for choking and stridor.   Gastrointestinal: Negative for vomiting and blood in stool.  Genitourinary: Negative for hematuria and decreased urine volume.  Musculoskeletal: Negative for acute joint swelling Skin: Negative for color change and wound.  Neurological: Negative for tremors and numbness other than noted  Psychiatric/Behavioral: Negative for decreased concentration or  hyperactivity.       Objective:   Physical Exam BP 126/64  Pulse 56  Wt 214 lb 12 oz (97.41 kg) VS noted,  Constitutional: Pt appears well-developed and well-nourished.  HENT: Head: NCAT.  Right Ear: External ear normal.  Left Ear: External ear normal.  Eyes: Conjunctivae and EOM are normal. Pupils are equal, round, and reactive to light.  Neck: Normal range of motion. Neck supple.  Cardiovascular: Normal rate and regular rhythm.   Pulmonary/Chest: Effort normal and breath sounds normal.  Abd:  Soft, NT, non-distended, + BS Neurological: Pt is alert. Not confused  Skin: Skin is warm. No erythema.  Tender right lateral epicondylar, and right subacromial Psychiatric: Pt behavior is normal. Thought content normal.     Assessment & Plan:

## 2014-02-16 NOTE — Patient Instructions (Signed)
Please stop at the scheduling desk to make an appointment with Dr Katrinka BlazingSmith in our office for the right shoulder and right elbow pain  Please continue all other medications as before, and refills have been done if requested. Please have the pharmacy call with any other refills you may need.  Please keep your appointments with your specialists as you may have planned  Please go to the LAB in the Basement (turn left off the elevator) for the tests to be done today You will be contacted by phone if any changes need to be made immediately.  Otherwise, you will receive a letter about your results with an explanation, but please check with MyChart first.  Please remember to sign up for MyChart if you have not done so, as this will be important to you in the future with finding out test results, communicating by private email, and scheduling acute appointments online when needed.  Please return in 6 months, or sooner if needed

## 2014-02-16 NOTE — Assessment & Plan Note (Addendum)
?   ETOH or supplement related, stable overall by history and exam, recent data reviewed with pt, and pt to continue medical treatment as before,  to f/u any worsening symptoms or concerns, for f/u labs Lab Results  Component Value Date   WBC 5.8 08/17/2013   HGB 12.6* 08/17/2013   HCT 39.4 08/17/2013   PLT 183.0 08/17/2013   GLUCOSE 96 08/17/2013   CHOL 183 08/17/2013   TRIG 70.0 08/17/2013   HDL 76.10 08/17/2013   LDLCALC 93 08/17/2013   ALT 53 02/16/2014   AST 49* 02/16/2014   NA 140 08/17/2013   K 3.7 08/17/2013   CL 103 08/17/2013   CREATININE 1.2 08/17/2013   BUN 11 08/17/2013   CO2 28 08/17/2013   TSH 2.91 08/17/2013   PSA 0.77 08/17/2013   HGBA1C 6.0 08/17/2013

## 2014-02-27 ENCOUNTER — Encounter: Payer: Self-pay | Admitting: Family Medicine

## 2014-02-27 ENCOUNTER — Other Ambulatory Visit (INDEPENDENT_AMBULATORY_CARE_PROVIDER_SITE_OTHER): Payer: 59

## 2014-02-27 ENCOUNTER — Ambulatory Visit (INDEPENDENT_AMBULATORY_CARE_PROVIDER_SITE_OTHER): Payer: 59 | Admitting: Family Medicine

## 2014-02-27 VITALS — BP 122/70 | HR 71 | Wt 212.0 lb

## 2014-02-27 DIAGNOSIS — M75101 Unspecified rotator cuff tear or rupture of right shoulder, not specified as traumatic: Secondary | ICD-10-CM | POA: Insufficient documentation

## 2014-02-27 DIAGNOSIS — M79609 Pain in unspecified limb: Secondary | ICD-10-CM

## 2014-02-27 DIAGNOSIS — M79601 Pain in right arm: Secondary | ICD-10-CM

## 2014-02-27 DIAGNOSIS — S43429A Sprain of unspecified rotator cuff capsule, initial encounter: Secondary | ICD-10-CM

## 2014-02-27 DIAGNOSIS — M7710 Lateral epicondylitis, unspecified elbow: Secondary | ICD-10-CM

## 2014-02-27 DIAGNOSIS — M7711 Lateral epicondylitis, right elbow: Secondary | ICD-10-CM

## 2014-02-27 MED ORDER — MELOXICAM 15 MG PO TABS: 15.0000 mg | ORAL_TABLET | Freq: Every day | ORAL | Status: DC

## 2014-02-27 NOTE — Assessment & Plan Note (Signed)
Patient does have a lateral epicondylitis. Patient was given a wrist brace to avoid any wrist extension. We discussed different changes in his activities of daily living the can stop wrist extension. Patient will wear a brace daily and nightly 42 weeks for nightly for 2 weeks. Discussed icing protocol and we'll do a five-day burst of anti-inflammatories. Patient try these interventions and come back again in 3 weeks for further evaluation.

## 2014-02-27 NOTE — Patient Instructions (Addendum)
Good to meet you Ice 20 minutes 2 times a day to shoulder and elbow Wear brace day and night for 2 weeks then nightly for 2 weeks.  Meloxicam daily for 5 days then as needed.  exercises 3 times week Avoid extension with wrist and hands outside peripheral vision.  See me again in 3 weeks.

## 2014-02-27 NOTE — Progress Notes (Signed)
Andre ScaleZach [name] D.O. [facility] [address] [place], [place] [address] Phone: [phone] Subjective:    I'm seeing this patient by the request  of:  Oliver BarreJames John, MD   CC: Right shoulder and elbow pain  FAO:[id number]:Subjective Andre MeliaDavid B Morgan is a 43 y.o. male coming in with complaint of right shoulder and elbow pain. Regarding patient's right shoulder pain he has had this pain for approximately 2 years. Patient states it is not number any true injury. Patient does wait list for significant amount notices it when he is reaching a significant weight or reaching behind his seen in the car he had some pain. Patient describes it as sharp with a dull aching sensation afterwards. Denies any radiation down the arm or any numbness. Denies any neck pain that is associated with the. Denies waking him up at night. Does respond to ibuprofen the patient is on lithium so he has to use this sparingly. Patient is a severity of 4/10.  Right elbow pain patient has had for last 2 months. Patient had a diagnosis of lateral epicondylitis on his contralateral arm previously. Patient states that this one seems to be worse than the other side ever was. Patient states it hurts with any type of activity such as picking up something region throwing something like a ball. Patient denies any injury, denies any swelling denies any radiation or any numbness in the fingers. Patient states that it is sore and has woken him up at night. Severity is 6/10.     Past medical history, social, surgical and family history all reviewed in electronic medical record.   Review of Systems: No headache, visual changes, nausea, vomiting, diarrhea, constipation, dizziness, abdominal pain, skin rash, fevers, chills, night sweats, weight loss, swollen lymph nodes, body aches, joint swelling, muscle aches, chest pain, shortness of breath, mood changes.   Objective Blood pressure 122/70, pulse 71, weight 212 lb (96.163 kg), SpO2 99.00%.    General: No apparent distress alert and oriented x3 mood and affect normal, dressed appropriately.  HEENT: Pupils equal, extraocular movements intact  Respiratory: Patient's speak in full sentences and does not appear short of breath  Cardiovascular: No lower extremity edema, non tender, no erythema  Skin: Warm dry intact with no signs of infection or rash on extremities or on axial skeleton.  Abdomen: Soft nontender  Neuro: Cranial nerves II through XII are intact, neurovascularly intact in all extremities with 2+ DTRs and 2+ pulses.  Lymph: No lymphadenopathy of posterior or anterior cervical chain or axillae bilaterally.  Gait normal with good balance and coordination.  MSK:  Non tender with full range of motion and good stability and symmetric strength and tone of  wrist, hip, knee and ankles bilaterally.  Shoulder: Right shoulder Inspection reveals no abnormalities, atrophy or asymmetry. Palpation is normal with no tenderness over AC joint or bicipital groove. ROM is full in all planes. Rotator cuff strength normal throughout. signs of impingement with positive Neer and Hawkin's tests, empty can sign. Speeds and Yergason's tests normal. No labral pathology noted with negative Obrien's, negative clunk and good stability. Normal scapular function observed. No painful arc and no drop arm sign. No apprehension sign Contralateral shoulder unremarkable  Elbow: Right Unremarkable to inspection. Range of motion full pronation, supination, flexion, extension. Strength is full to all of the above directions Stable to varus, valgus stress. Negative moving valgus stress test. Tender over the lateral epicondylitis and pain with resisted extension of the wrist and the same vicinity.  Ulnar nerve does not sublux. Negative cubital tunnel Tinel's.  MSK US performed of: Right shoulder This study was ordered, performed, and interpreted by Terrilee FilesZach Vernadine Coombs D.O.  Shoulder:   Supraspinatus: Patient  does have partial thickness rotator cuff tear of the supraspinatus and about 40% of the tendon. No retraction noted. Infraspinatus:  Appears normal on long and transverse views. Subscapularis:  Appears normal on long and transverse views. Teres Minor:  Appears normal on long and transverse views. AC joint:  Capsule undistended, no geyser sign. Glenohumeral Joint:  Appears normal without effusion. Glenoid Labrum:  Intact without visualized tears. Biceps Tendon:  Appears normal on long and transverse views, no fraying of tendon, tendon located in intertubercular groove, no subluxation with shoulder internal or external rotation. No increased power doppler signal.  Impression: Isolated supraspinatus partial thickness tear  Procedure: Real-time Ultrasound Guided Injection of right glenohumeral joint Device: GE Logiq E  Ultrasound guided injection is preferred based studies that show increased duration, increased effect, greater accuracy, decreased procedural pain, increased response rate with ultrasound guided versus blind injection.  Verbal informed consent obtained.  Time-out conducted.  Noted no overlying erythema, induration, or other signs of local infection.  Skin prepped in a sterile fashion.  Local anesthesia: Topical Ethyl chloride.  With sterile technique and under real time ultrasound guidance:  Joint visualized.  23g 1  inch needle inserted posterior approach. Pictures taken for needle placement. Patient did have injection of 2 cc of 1% lidocaine, 2 cc of 0.5% Marcaine, and 1.0 cc of Kenalog 40 mg/dL. Completed without difficulty  Pain immediately resolved suggesting accurate placement of the medication.  Advised to call if fevers/chills, erythema, induration, drainage, or persistent bleeding.  Images permanently stored and available for review in the ultrasound unit.  Impression: Technically successful ultrasound guided injection.     Impression and Recommendations:     This  case required medical decision making of moderate complexity.

## 2014-02-27 NOTE — Assessment & Plan Note (Signed)
Injected today. Patient had complete resolution of pain mostly medially. Patient was given home exercise program to work on range of motion and strengthening. Encourage him to avoid any heavy lifting until patient is seen again in 3 weeks. We discussed icing and a short burst of anti-inflammatories. Patient will try these interventions and come back again in 3 weeks for further evaluation. We may need to consider repeat ultrasound.

## 2014-03-21 ENCOUNTER — Ambulatory Visit: Payer: 59 | Admitting: Family Medicine

## 2014-03-21 DIAGNOSIS — Z0289 Encounter for other administrative examinations: Secondary | ICD-10-CM

## 2014-04-16 ENCOUNTER — Ambulatory Visit: Payer: 59 | Admitting: Licensed Clinical Social Worker

## 2014-04-17 ENCOUNTER — Ambulatory Visit: Payer: 59 | Admitting: Family Medicine

## 2014-04-17 DIAGNOSIS — Z0289 Encounter for other administrative examinations: Secondary | ICD-10-CM

## 2014-04-20 ENCOUNTER — Ambulatory Visit: Payer: 59 | Admitting: Family Medicine

## 2014-04-23 ENCOUNTER — Other Ambulatory Visit (INDEPENDENT_AMBULATORY_CARE_PROVIDER_SITE_OTHER): Payer: 59

## 2014-04-23 ENCOUNTER — Encounter: Payer: Self-pay | Admitting: Family Medicine

## 2014-04-23 ENCOUNTER — Ambulatory Visit (INDEPENDENT_AMBULATORY_CARE_PROVIDER_SITE_OTHER): Payer: 59 | Admitting: Family Medicine

## 2014-04-23 ENCOUNTER — Encounter: Payer: Self-pay | Admitting: *Deleted

## 2014-04-23 VITALS — BP 120/72 | HR 56 | Ht 71.0 in | Wt 206.0 lb

## 2014-04-23 DIAGNOSIS — M25529 Pain in unspecified elbow: Secondary | ICD-10-CM

## 2014-04-23 DIAGNOSIS — M7711 Lateral epicondylitis, right elbow: Secondary | ICD-10-CM

## 2014-04-23 DIAGNOSIS — M25521 Pain in right elbow: Secondary | ICD-10-CM

## 2014-04-23 DIAGNOSIS — M7710 Lateral epicondylitis, unspecified elbow: Secondary | ICD-10-CM

## 2014-04-23 MED ORDER — DICLOFENAC SODIUM 2 % TD SOLN: TRANSDERMAL | Status: DC

## 2014-04-23 MED ORDER — NITROGLYCERIN 0.2 MG/HR TD PT24: MEDICATED_PATCH | TRANSDERMAL | Status: DC

## 2014-04-23 NOTE — Patient Instructions (Addendum)
Good to see you again.  Continue with the exercises 3 times a week.  Ice still 20 minutes 2 times a day Exercises 3 times a week Wear brace day and night for 2 weeks  No heavy lifting at work and remember thumbs up.  Nitroglycerin Protocol   Apply 1/4 nitroglycerin patch to affected area daily.  Change position of patch within the affected area every 24 hours.  You may experience a headache during the first 1-2 weeks of using the patch, these should subside.  If you experience headaches after beginning nitroglycerin patch treatment, you may take your preferred over the counter pain reliever.  Another side effect of the nitroglycerin patch is skin irritation or rash related to patch adhesive.  Please notify our office if you develop more severe headaches or rash, and stop the patch.  Tendon healing with nitroglycerin patch may require 12 to 24 weeks depending on the extent of injury.  Men should not use if taking Viagra, Cialis, or Levitra.   Do not use if you have migraines or rosacea.   See me again in 2-3 weeks.

## 2014-04-23 NOTE — Progress Notes (Signed)
Andre Morgan D.O.  [facility] [address] [place], [place] [address] Phone: [phone] Subjective:    I'm seeing this patient by the request  of:  Oliver BarreJames John, MD   CC: Right shoulder and elbow pain  BJY:[id number]:Subjective Army MeliaDavid B Avitia is a 43 y.o. male coming in with complaint of right shoulder and elbow pain.  Patient was seen previously for both problems. Patient did have an injection in his right shoulder for a supraspinatus tear. Patient states that his shoulder has completely resolved. Patient is having no pain and denies any weakness patient is very happy and states that he is 100% better at this time. Patient unfortunately continues to have pain in his right elbow. Patient states that it seems to be getting worse. Patient states he used to her only with activity but now has a dull aching pain even at rest. Worse when he tries to do any type of gripping. Denies any specific radiation but states that it is affecting his daily activities.      Past medical history, social, surgical and family history all reviewed in electronic medical record.   Review of Systems: No headache, visual changes, nausea, vomiting, diarrhea, constipation, dizziness, abdominal pain, skin rash, fevers, chills, night sweats, weight loss, swollen lymph nodes, body aches, joint swelling, muscle aches, chest pain, shortness of breath, mood changes.   Objective Blood pressure 120/72, pulse 56, height 5\' 11"  (1.803 m), weight 206 lb (93.441 kg), SpO2 98.00%.  General: No apparent distress alert and oriented x3 mood and affect normal, dressed appropriately.  HEENT: Pupils equal, extraocular movements intact  Respiratory: Patient's speak in full sentences and does not appear short of breath  Cardiovascular: No lower extremity edema, non tender, no erythema  Skin: Warm dry intact with no signs of infection or rash on extremities or on axial skeleton.  Abdomen: Soft nontender  Neuro: Cranial nerves II  through XII are intact, neurovascularly intact in all extremities with 2+ DTRs and 2+ pulses.  Lymph: No lymphadenopathy of posterior or anterior cervical chain or axillae bilaterally.  Gait normal with good balance and coordination.  MSK:  Non tender with full range of motion and good stability and symmetric strength and tone of  wrist, hip, knee and ankles bilaterally.  Shoulder: Right Inspection reveals no abnormalities, atrophy or asymmetry. Palpation is normal with no tenderness over AC joint or bicipital groove. ROM is full in all planes. Rotator cuff strength normal throughout. No signs of impingement with negative Neer and Hawkin's tests, empty can sign. Speeds and Yergason's tests normal. No labral pathology noted with negative Obrien's, negative clunk and good stability. Normal scapular function observed. No painful arc and no drop arm sign. No apprehension sign     Elbow: Right Unremarkable to inspection. Range of motion full pronation, supination, flexion, extension. Strength is full to all of the above directions Stable to varus, valgus stress. Negative moving valgus stress test. Tender over the lateral epicondylitis and pain with resisted extension of the wrist and the same vicinity. Same as previous exam Ulnar nerve does not sublux. Negative cubital tunnel Tinel's. Contralateral elbow unremarkable  Musculoskeletal ultrasound was performed and interpreted by Terrilee FilesZach Morgan D.O.   Elbow:  Lateral epicondyle and common extensor tendon origin visualized. Patient does have edema noted as well as what appears to be a very small tear approximately 20% of the tendon. No retraction noted. No avulsion noted. Radial head unremarkable and located in annular ligament Medial epicondyle and common flexor tendon  origin visualized.  No edema, effusions, or avulsions seen. Ulnar nerve in cubital tunnel unremarkable. Olecranon and triceps insertion visualized and unremarkable without  edema, effusion, or avulsion.  No signs olecranon bursitis. Power doppler signal normal.  IMPRESSION:  Common extensor tendon tear  .     Impression and Recommendations:     This case required medical decision making of moderate complexity.

## 2014-04-23 NOTE — Assessment & Plan Note (Signed)
Patient does have fairly severe lateral epicondylitis. Patient was not wearing the brace because he was not allowed to at work. Patient was given a note now to wear the brace day and night for the next 2 weeks. We discussed icing and will start nitroglycerin. Patient was warned the potential side effects. Patient was given a home exercise program again do a regular basis. Patient will also try a topical anti-inflammatory. Patient will then come back again in 3 weeks for further evaluation and treatment. Continuing to have pain we can consider corticosteroid injection.  Spent greater than 25 minutes with patient face-to-face and had greater than 50% of counseling including as described above in assessment and plan.

## 2014-05-07 ENCOUNTER — Ambulatory Visit: Payer: 59 | Admitting: Family Medicine

## 2014-05-07 DIAGNOSIS — Z0289 Encounter for other administrative examinations: Secondary | ICD-10-CM

## 2014-05-10 ENCOUNTER — Telehealth: Payer: Self-pay | Admitting: Family Medicine

## 2014-05-10 NOTE — Telephone Encounter (Signed)
Patient no showed on 7/6 for 2 week follow up.  Right now there are no other scheduled appts with Dr. Katrinka BlazingSmith.  Please advise.  Thanks!

## 2014-05-10 NOTE — Telephone Encounter (Signed)
Noted  

## 2014-05-13 ENCOUNTER — Emergency Department (HOSPITAL_COMMUNITY): Payer: PRIVATE HEALTH INSURANCE

## 2014-05-13 ENCOUNTER — Emergency Department (HOSPITAL_COMMUNITY)
Admission: EM | Admit: 2014-05-13 | Discharge: 2014-05-13 | Disposition: A | Discharge status: Home / Self Care | Payer: PRIVATE HEALTH INSURANCE | Attending: Emergency Medicine | Admitting: Emergency Medicine

## 2014-05-13 ENCOUNTER — Encounter (HOSPITAL_COMMUNITY): Payer: Self-pay | Admitting: Emergency Medicine

## 2014-05-13 DIAGNOSIS — S199XXA Unspecified injury of neck, initial encounter: Secondary | ICD-10-CM

## 2014-05-13 DIAGNOSIS — M25511 Pain in right shoulder: Secondary | ICD-10-CM

## 2014-05-13 DIAGNOSIS — S0993XA Unspecified injury of face, initial encounter: Secondary | ICD-10-CM | POA: Insufficient documentation

## 2014-05-13 DIAGNOSIS — Z79899 Other long term (current) drug therapy: Secondary | ICD-10-CM | POA: Insufficient documentation

## 2014-05-13 DIAGNOSIS — Y929 Unspecified place or not applicable: Secondary | ICD-10-CM | POA: Insufficient documentation

## 2014-05-13 DIAGNOSIS — Z791 Long term (current) use of non-steroidal anti-inflammatories (NSAID): Secondary | ICD-10-CM | POA: Insufficient documentation

## 2014-05-13 DIAGNOSIS — Z8659 Personal history of other mental and behavioral disorders: Secondary | ICD-10-CM | POA: Insufficient documentation

## 2014-05-13 DIAGNOSIS — W2209XA Striking against other stationary object, initial encounter: Secondary | ICD-10-CM | POA: Insufficient documentation

## 2014-05-13 DIAGNOSIS — Z87891 Personal history of nicotine dependence: Secondary | ICD-10-CM | POA: Insufficient documentation

## 2014-05-13 DIAGNOSIS — Z8619 Personal history of other infectious and parasitic diseases: Secondary | ICD-10-CM | POA: Insufficient documentation

## 2014-05-13 DIAGNOSIS — T148XXA Other injury of unspecified body region, initial encounter: Secondary | ICD-10-CM

## 2014-05-13 DIAGNOSIS — Y9389 Activity, other specified: Secondary | ICD-10-CM | POA: Insufficient documentation

## 2014-05-13 DIAGNOSIS — S40019A Contusion of unspecified shoulder, initial encounter: Secondary | ICD-10-CM | POA: Insufficient documentation

## 2014-05-13 MED ORDER — IBUPROFEN 600 MG PO TABS: 600.0000 mg | ORAL_TABLET | Freq: Four times a day (QID) | ORAL | Status: DC | PRN

## 2014-05-13 NOTE — ED Notes (Signed)
Patient sitting in exam room.  No s/sx of distress.  No new orders

## 2014-05-13 NOTE — ED Provider Notes (Signed)
CSN: [id number]     Arrival date & time 05/13/14  0524 History   First MD Initiated Contact with Patient 05/13/14 [phone]     Chief Complaint  Patient presents with  . Shoulder Injury     (Consider location/radiation/quality/duration/timing/severity/associated sxs/prior Treatment) The history is provided by the patient. No language interpreter was used.  Andre Morgan is a 43 year old male with past medical history of depression, bipolar disorder, alcohol abuse presenting to the ED with a shoulder injury. As per patient, reported that he hit his right shoulder on the refrigerator door approximately one hour prior to arrival to the ED. Patient reported that when he hit his shoulder blade on the door he had an instant shooting pain with radiation to his neck. Patient reported that the pain worsens with raising and pulling of the right shoulder. Patient describes the discomfort as a sharp, aching pain with mild radiation towards the right side the neck. Patient reports and previous injury to the right shoulder-rotator cuff tear. Patient denied numbness, tingling, loss of sensation, weakness, headache. PCP Dr. Jonny RuizJohn Patient has an orthopedic specialist.   Past Medical History  Diagnosis Date  . Bipolar disorder 12/05/2011  . Alcohol abuse 12/05/2011  . 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 Depression   . Erectile dysfunction 12/09/2011  . 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 No pertinent past surgical history. History reviewed. No pertinent family history. History  Substance Use Topics  . Smoking status: Former Games developermoker  . Smokeless tobacco: Not on file  . Alcohol Use: No    Review of Systems  Musculoskeletal: Positive for arthralgias (Right shoulder pain) and neck pain (Referred pain). Negative for joint swelling and neck stiffness.  Skin: Negative for color change.  Neurological: Negative for weakness and numbness.      Allergies  Review of patient's allergies indicates no  known allergies.  Home Medications   Prior to Admission medications   Medication Sig Start Date End Date Taking? Authorizing Provider  Diclofenac Sodium 2 % SOLN Apply twice daily 04/23/14  Yes Judi SaaZachary M Smith, DO  nitroGLYCERIN (NITRODUR - DOSED IN MG/24 HR) 0.2 mg/hr patch 1/4 patch daily 04/23/14  Yes Judi SaaZachary M Smith, DO  valACYclovir (VALTREX) 1000 MG tablet Take 1 tablet (1,000 mg total) by mouth 2 (two) times daily. As needed for cold sores 11/24/13  Yes Corwin LevinsJames W John, MD  ibuprofen (ADVIL,MOTRIN) 600 MG tablet Take 1 tablet (600 mg total) by mouth every 6 (six) hours as needed. 05/13/14   Hedy Garro, PA-C   BP 110/71  Pulse 45  Temp(Src) 97.3 F (36.3 C) (Oral)  Resp 18  Ht 5\' 11"  (1.803 m)  Wt 202 lb (91.627 kg)  BMI 28.19 kg/m2  SpO2 95% Physical Exam  Nursing note and vitals reviewed. Constitutional: He is oriented to person, place, and time. He appears well-developed and well-nourished. No distress.  HENT:  Head: Normocephalic and atraumatic.  Mouth/Throat: Oropharynx is clear and moist. No oropharyngeal exudate.  Eyes: Conjunctivae and EOM are normal. Pupils are equal, round, and reactive to light. Right eye exhibits no discharge. Left eye exhibits no discharge.  Neck: Normal range of motion. Neck supple. No tracheal deviation present.  Negative neck stiffness Negative nuchal rigidity  Negative cervical lymphadenopathy  Negative meningeal signs  Cardiovascular: Normal rate, regular rhythm and normal heart sounds.  Exam reveals no friction rub.   No murmur heard. Pulses:      Radial pulses are 2+ on the right side,  and 2+ on the left side.  Cap refill less than 3 seconds  Pulmonary/Chest: Effort normal and breath sounds normal. No respiratory distress. He has no wheezes. He has no rales.  Patient is able to speak in full sentences without difficulty negative use of accessory muscles Negative stridor  Musculoskeletal: He exhibits tenderness. He exhibits no edema.        Right shoulder: He exhibits tenderness. He exhibits normal range of motion, no bony tenderness, no swelling, no effusion, no crepitus, no deformity, no laceration, no pain, no spasm, normal pulse and normal strength.       Arms: Negative deformities, malalignments, lesions, sores, ecchymosis identified to the right shoulder. Negative sunken in appearance. Full flexion, extension, abduction, abduction, inversion and eversion of the right shoulder noted without difficulty or ataxia. Negative tenting clavicles noted. Discomfort upon palpation to the trapezius muscle - tension noted upon palpation. Full range of motion to the right elbow, right wrist and digits of the right hand without difficulty or ataxia.  Lymphadenopathy:    He has no cervical adenopathy.  Neurological: He is alert and oriented to person, place, and time. No cranial nerve deficit. He exhibits normal muscle tone. Coordination normal.  Cranial nerves III-XII grossly intact Strength 5+/5+ to upper extremities bilaterally with resistance applied, equal distribution noted Strength intact to MCP, PIP, DIP joints of right hand Sensation intact with differentiation to sharp and dull touch Equal grip strength bilaterally Gait proper, proper balance - negative sway, negative drift, negative step-offs  Skin: Skin is warm and dry. No rash noted. He is not diaphoretic. No erythema.  Psychiatric: He has a normal mood and affect. His behavior is normal. Thought content normal.    ED Course  Procedures (including critical care time) Labs Review Labs Reviewed - No data to display  Imaging Review Dg Shoulder Right  05/13/2014   CLINICAL DATA:  Pain with range of motion after injury.  EXAM: RIGHT SHOULDER - 2+ VIEW  COMPARISON:  None.  FINDINGS: There is no evidence of fracture or dislocation. There is no evidence of arthropathy or other focal bone abnormality. Soft tissues are unremarkable.  IMPRESSION: Negative.   Electronically Signed    By: Burman Nieves M.D.   On: 05/13/2014 06:25     EKG Interpretation None      MDM   Final diagnoses:  Right shoulder pain  Muscle contusion    Filed Vitals:   05/13/14 0553 05/13/14 0553 05/13/14 0752  BP:  106/72 110/71  Pulse: 63  45  Temp: 97.7 F (36.5 C)  97.3 F (36.3 C)  TempSrc: Oral  Oral  Resp: 15  18  Height: 5\' 11"  (1.803 m)    Weight: 202 lb (91.627 kg)    SpO2: 100%  95%   Plain film of right shoulder negative for acute osseous injury-soft tissues are unremarkable. Negative focal neurological deficits noted. Patient is able to move right shoulder without difficulty - full range of motion noted without ataxia. Joints intact to MCP, PIP, MCP joints the right hand. Sensation intact with differentiation sharp and dull touch. Cap refill less than 3 seconds. Equal grip strength bilaterally. Doubt compartment syndrome. Doubt ischemia. Suspicion to be muscular contusion secondary to injury. Patient stable, afebrile. Patient not septic appearing. Discharged patient. Discussed with patient to rest, ice, elevate. Discussed with patient to massage icy hot ointment. Referred to PCP and orthopedics. Discussed with patient to closely monitor symptoms and if symptoms are to worsen or change to report  back to the ED - strict return instructions given.  Patient agreed to plan of care, understood, all questions answered.     Raymon Mutton, PA-C 05/13/14 1713

## 2014-05-13 NOTE — ED Provider Notes (Signed)
Medical screening examination/treatment/procedure(s) were performed by non-physician practitioner and as supervising physician I was immediately available for consultation/collaboration.   EKG Interpretation None        Joya Gaskinsonald W Arella Blinder, MD 05/13/14 2354

## 2014-05-13 NOTE — ED Notes (Signed)
Patient states that he was working, was going into refrigerator, hit right shoulder blade on the top of refrigerator door.

## 2014-05-13 NOTE — Discharge Instructions (Signed)
Please call your doctor for a followup appointment within 24-48 hours. When you talk to your doctor please let them know that you were seen in the emergency department and have them acquire all of your records so that they can discuss the findings with you and formulate a treatment plan to fully care for your new and ongoing problems. Please call and set-up an appointment with your primary care provider to be seen and re-assessed Please rest and stay hydrated Please apply icy hot ointment and massage to aid in muscular relief Please take medications as prescribed - no more than 2400 mg per day Please avoid any physical or strenuous activity  Please continue to monitor symptoms closely and if symptoms are to worsen or change (fever greater than 101, chills, sweating, nausea, vomiting, chest pain, shortness of breath, difficulty breathing, numbness, tingling, weakness, fall, injury) please report back to the ED immediately    Arthralgia Your caregiver has diagnosed you as suffering from an arthralgia. Arthralgia means there is pain in a joint. This can come from many reasons including:  Bruising the joint which causes soreness (inflammation) in the joint.  Wear and tear on the joints which occur as we grow older (osteoarthritis).  Overusing the joint.  Various forms of arthritis.  Infections of the joint. Regardless of the cause of pain in your joint, most of these different pains respond to anti-inflammatory drugs and rest. The exception to this is when a joint is infected, and these cases are treated with antibiotics, if it is a bacterial infection. HOME CARE INSTRUCTIONS   Rest the injured area for as long as directed by your caregiver. Then slowly start using the joint as directed by your caregiver and as the pain allows. Crutches as directed may be useful if the ankles, knees or hips are involved. If the knee was splinted or casted, continue use and care as directed. If an stretchy or  elastic wrapping bandage has been applied today, it should be removed and re-applied every 3 to 4 hours. It should not be applied tightly, but firmly enough to keep swelling down. Watch toes and feet for swelling, bluish discoloration, coldness, numbness or excessive pain. If any of these problems (symptoms) occur, remove the ace bandage and re-apply more loosely. If these symptoms persist, contact your caregiver or return to this location.  For the first 24 hours, keep the injured extremity elevated on pillows while lying down.  Apply ice for 15-20 minutes to the sore joint every couple hours while awake for the first half day. Then 03-04 times per day for the first 48 hours. Put the ice in a plastic bag and place a towel between the bag of ice and your skin.  Wear any splinting, casting, elastic bandage applications, or slings as instructed.  Only take over-the-counter or prescription medicines for pain, discomfort, or fever as directed by your caregiver. Do not use aspirin immediately after the injury unless instructed by your physician. Aspirin can cause increased bleeding and bruising of the tissues.  If you were given crutches, continue to use them as instructed and do not resume weight bearing on the sore joint until instructed. Persistent pain and inability to use the sore joint as directed for more than 2 to 3 days are warning signs indicating that you should see a caregiver for a follow-up visit as soon as possible. Initially, a hairline fracture (break in bone) may not be evident on X-rays. Persistent pain and swelling indicate that further evaluation,  non-weight bearing or use of the joint (use of crutches or slings as instructed), or further X-rays are indicated. X-rays may sometimes not show a small fracture until a week or 10 days later. Make a follow-up appointment with your own caregiver or one to whom we have referred you. A radiologist (specialist in reading X-rays) may read your  X-rays. Make sure you know how you are to obtain your X-ray results. Do not assume everything is normal if you do not hear from [place]. SEEK MEDICAL CARE IF: Bruising, swelling, or pain increases. SEEK IMMEDIATE MEDICAL CARE IF:   Your fingers or toes are numb or blue.  The pain is not responding to medications and continues to stay the same or get worse.  The pain in your joint becomes severe.  You develop a fever over 102 F (38.9 C).  It becomes impossible to move or use the joint. MAKE SURE YOU:   Understand these instructions.  Will watch your condition.  Will get help right away if you are not doing well or get worse. Document Released: 10/19/2005 Document Revised: 01/11/2012 Document Reviewed: 06/06/2008 [facility] Patient Information 2015 [place], [place]. This information is not intended to replace advice given to you by your health care provider. Make sure you discuss any questions you have with your health care provider.

## 2014-10-11 ENCOUNTER — Telehealth: Payer: Self-pay | Admitting: Internal Medicine

## 2014-10-11 DIAGNOSIS — M79676 Pain in unspecified toe(s): Secondary | ICD-10-CM

## 2014-10-11 NOTE — Telephone Encounter (Signed)
Referral done to podiatry

## 2014-10-11 NOTE — Telephone Encounter (Signed)
Pt wants to be referred to a foot MD, pain 1st toe. Does he have to see primary first?   [phone]

## 2014-10-23 ENCOUNTER — Ambulatory Visit (INDEPENDENT_AMBULATORY_CARE_PROVIDER_SITE_OTHER): Payer: 59

## 2014-10-23 VITALS — BP 104/69 | HR 82 | Resp 12

## 2014-10-23 DIAGNOSIS — M2010 Hallux valgus (acquired), unspecified foot: Secondary | ICD-10-CM

## 2014-10-23 DIAGNOSIS — M778 Other enthesopathies, not elsewhere classified: Secondary | ICD-10-CM

## 2014-10-23 DIAGNOSIS — M7750 Other enthesopathy of unspecified foot: Secondary | ICD-10-CM

## 2014-10-23 DIAGNOSIS — M779 Enthesopathy, unspecified: Secondary | ICD-10-CM

## 2014-10-23 DIAGNOSIS — R52 Pain, unspecified: Secondary | ICD-10-CM

## 2014-10-23 NOTE — Progress Notes (Signed)
   Subjective:    Patient ID: Andre Morgan, male    DOB: 11/24/1970, 43 y.o.   MRN: [id number]  HPI  PT STATED B/L BUNION BEEN SORE FOR 1.5 YEARS. THE FOOT ARE GETTING WORSE AND WEARING CERTAIN SHOES IT HURT. TRIED TO WEAR WIDER SHOES AND IT HELP SOME  Review of Systems  Musculoskeletal: Positive for myalgias and joint swelling.  All other systems reviewed and are negative.      Objective:   Physical Exam 43 year old [place] well-developed well-nourished oriented 3 presents this time with bilateral bunion pain right more so than left has significant HAV deformity bilateral clinically with lateral deviation hallux in some lateral displacement of second digit. Trays reviewed reveal sesamoid position 4 bipartite tibial sesamoid left IM angle 12 hallux abductus angle 2025 with some asymmetric joint space narrowing first MTP area and some slight dorsal spurring fracture no osseous abnormalities otherwise noted for neurovascular status pedal pulses are palpable DP and PT +2 over 4 Refill time 3 seconds epicritic and proprioceptive sensations intact and symmetric bilateral plantar response DTRs not listed dermatologic the skin color pigment normal hair growth absent bilateral. Nails criptotic bilateral.       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 Bilateral plan this time discussed options patient is a in surgical correction at this time [facility] foot is reviewed and signed right being more symptomatic risk L Turner's reviewed patient will be in air fracture boot for 1 month duration with weightbearing involves osteotomy first metatarsal with pin or screw fixation consent form was reviewed and signed all questions asked by the patient are answered and surgery scheduled his convenience sometime in January of this coming year for maintain accommodative shoes pads and cushions avoid ballistic activities also  briefly discussed patient would benefit from orthoses in the future as well  Alvan Dameichard Elleanna Melling DPM

## 2014-10-23 NOTE — Patient Instructions (Signed)
Pre-Operative Instructions  Congratulations, you have decided to take an important step to improving your quality of life.  You can be assured that the doctors of [facility] will be with you every step of the way.  1. Plan to be at the surgery center/hospital at least 1 (one) hour prior to your scheduled time unless otherwise directed by the surgical center/hospital staff.  You must have a responsible adult accompany you, remain during the surgery and drive you home.  Make sure you have directions to the surgical center/hospital and know how to get there on time. 2. For hospital based surgery you will need to obtain a history and physical form from your family physician within 1 month prior to the date of surgery- we will give you a form for you primary physician.  3. We make every effort to accommodate the date you request for surgery.  There are however, times where surgery dates or times have to be moved.  We will contact you as soon as possible if a change in schedule is required.   4. No Aspirin/Ibuprofen for one week before surgery.  If you are on aspirin, any non-steroidal anti-inflammatory medications (Mobic, Aleve, Ibuprofen) you should stop taking it 7 days prior to your surgery.  You make take Tylenol  For pain prior to surgery.  5. Medications- If you are taking daily heart and blood pressure medications, seizure, reflux, allergy, asthma, anxiety, pain or diabetes medications, make sure the surgery center/hospital is aware before the day of surgery so they may notify you which medications to take or avoid the day of surgery. 6. No food or drink after midnight the night before surgery unless directed otherwise by surgical center/hospital staff. 7. No alcoholic beverages 24 hours prior to surgery.  No smoking 24 hours prior to or 24 hours after surgery. 8. Wear loose pants or shorts- loose enough to fit over bandages, boots, and casts. 9. No slip on shoes, sneakers are best. 10. Bring  your boot with you to the surgery center/hospital.  Also bring crutches or a walker if your physician has prescribed it for you.  If you do not have this equipment, it will be provided for you after surgery. 11. If you have not been contracted by the surgery center/hospital by the day before your surgery, call to confirm the date and time of your surgery. 12. Leave-time from work may vary depending on the type of surgery you have.  Appropriate arrangements should be made prior to surgery with your employer. 13. Prescriptions will be provided immediately following surgery by your doctor.  Have these filled as soon as possible after surgery and take the medication as directed. 14. Remove nail polish on the operative foot. 15. Wash the night before surgery.  The night before surgery wash the foot and leg well with the antibacterial soap provided and water paying special attention to beneath the toenails and in between the toes.  Rinse thoroughly with water and dry well with a towel.  Perform this wash unless told not to do so by your physician.  Enclosed: 1 Ice pack (please put in freezer the night before surgery)   1 Hibiclens skin cleaner   Pre-op Instructions  If you have any questions regarding the instructions, do not hesitate to call our office.  [place]: [address] [place], [place] [address] [phone]  [place]: [address] [place], [place] [address] [phone]  [place]: [address]  [place], [place] [address] [phone]  Dr. Cachet Mccutchen   Tuchman DPM, Dr. Norman Regal DPM Dr. Anaih Brander DPM, Dr. M. Todd Hyatt DPM, Dr. Kathryn Egerton DPM 

## 2014-11-16 ENCOUNTER — Telehealth: Payer: Self-pay | Admitting: Internal Medicine

## 2014-11-16 MED ORDER — VALACYCLOVIR HCL 1 G PO TABS: 1000.0000 mg | ORAL_TABLET | Freq: Two times a day (BID) | ORAL | Status: DC

## 2014-11-16 NOTE — Telephone Encounter (Signed)
Pt called in needing a refill on his valACYclovir (VALTREX) 1000 MG tablet [[id number]][[id number]]

## 2014-11-16 NOTE — Telephone Encounter (Signed)
Sent to pharmacy....lmb 

## 2014-12-12 ENCOUNTER — Encounter: Payer: Self-pay | Admitting: Family Medicine

## 2014-12-12 ENCOUNTER — Ambulatory Visit (INDEPENDENT_AMBULATORY_CARE_PROVIDER_SITE_OTHER)
Admission: RE | Admit: 2014-12-12 | Discharge: 2014-12-12 | Disposition: A | Discharge status: Home / Self Care | Payer: 59 | Source: Ambulatory Visit | Attending: Family Medicine | Admitting: Family Medicine

## 2014-12-12 ENCOUNTER — Ambulatory Visit (INDEPENDENT_AMBULATORY_CARE_PROVIDER_SITE_OTHER): Payer: 59 | Admitting: Family Medicine

## 2014-12-12 ENCOUNTER — Other Ambulatory Visit (INDEPENDENT_AMBULATORY_CARE_PROVIDER_SITE_OTHER): Payer: 59

## 2014-12-12 VITALS — BP 112/62 | HR 66 | Ht 71.0 in | Wt 225.0 lb

## 2014-12-12 DIAGNOSIS — M7711 Lateral epicondylitis, right elbow: Secondary | ICD-10-CM

## 2014-12-12 DIAGNOSIS — M79601 Pain in right arm: Secondary | ICD-10-CM

## 2014-12-12 MED ORDER — MELOXICAM 15 MG PO TABS: 15.0000 mg | ORAL_TABLET | Freq: Every day | ORAL | Status: DC

## 2014-12-12 NOTE — Assessment & Plan Note (Signed)
Patient has tried nitroglycerin in the past with no significant improvement. Patient given meloxicam and will work with a Psychologist, educationaltrainer today. We discussed icing regimen. Patient will get an x-ray to rule out any bony abnormality. Discussed racing at night which I think would be helpful. Patient and will come back and see me again in 2-3 weeks. Patient may be a candidate PRP injection if not make improvement.

## 2014-12-12 NOTE — Patient Instructions (Signed)
Good to see you Ice 20 minutes 2 times daily. Usually after activity and before bed. Exercises 3 times a week.  Wear the brace at night and when not working.  Meloxicam daily for 10 days then as needed Try pennsaid twice daily Decrease weight 50% and only underhand or thumbsup.  See me in 2-3 weeks or call earlier if you want PRP.

## 2014-12-12 NOTE — Progress Notes (Signed)
Pre visit review using our clinic review tool, if applicable. No additional management support is needed unless otherwise documented below in the visit note. 

## 2014-12-12 NOTE — Progress Notes (Signed)
[name] [address] [place], [place] [address] Phone: [phone] Subjective:     CC: Right shoulder and elbow pain  AOZ:[id number] Andre Morgan is a 44 y.o. male coming in with complaint of right shoulder and elbow pain. Patient was seen greater than 6 months ago. Patient has had lateral epicondylitis continues to have elbow pain. Patient states that his shoulder pain is better.  Patient states that he continues to have the elbow pain. Patient did see another provider multiple months ago and was given an injection. Patient states that this injection was only temporary for approximately 3-4 weeks. Patient states that he had significant amount of pain and had weakness. Patient states that the weakness has improved with him not lifting but continues to have difficulty even with daily activities. Patient denies any numbness or tingling. Patient continues to have difficulty and it even wakes him up at night. Patient rates the severity of 7 out of 10.      Past medical history, social, surgical and family history all reviewed in electronic medical record.   Review of Systems: No headache, visual changes, nausea, vomiting, diarrhea, constipation, dizziness, abdominal pain, skin rash, fevers, chills, night sweats, weight loss, swollen lymph nodes, body aches, joint swelling, muscle aches, chest pain, shortness of breath, mood changes.   Objective Blood pressure 112/62, pulse 66, height  (1.803 m), weight 225 lb (102.059 kg), SpO2 99 %.  General: No apparent distress alert and oriented x3 mood and affect normal, dressed appropriately.  HEENT: Pupils equal, extraocular movements intact  Respiratory: Patient's speak in full sentences and does not appear short of breath  Cardiovascular: No lower extremity edema, non tender, no erythema  Skin: Warm dry intact with no signs of infection or rash on extremities or on axial skeleton.  Abdomen: Soft nontender    Neuro: Cranial nerves II through XII are intact, neurovascularly intact in all extremities with 2+ DTRs and 2+ pulses.  Lymph: No lymphadenopathy of posterior or anterior cervical chain or axillae bilaterally.  Gait normal with good balance and coordination.  MSK:  Non tender with full range of motion and good stability and symmetric strength and tone of  wrist, hip, knee and ankles bilaterally.  Shoulder: Right Inspection reveals no abnormalities, atrophy or asymmetry. Palpation is normal with no tenderness over AC joint or bicipital groove. ROM is full in all planes. Rotator cuff strength normal throughout. No signs of impingement with negative Neer and Hawkin's tests, empty can sign. Speeds and Yergason's tests normal. No labral pathology noted with negative Obrien's, negative clunk and good stability. Normal scapular function observed. No painful arc and no drop arm sign. No apprehension sign    Elbow: Right Unremarkable to inspection. Range of motion full pronation, supination, flexion, extension. Strength is full to all of the above directions Stable to varus, valgus stress. Negative moving valgus stress test. Tender over the lateral epicondylitis and pain with resisted extension of the wrist and the same vicinity.  no weakness noted  Ulnar nerve does not sublux. Negative cubital tunnel Tinel's. Contralateral elbow unremarkable  Musculoskeletal ultrasound was performed and interpreted by Terrilee Files D.O.   Elbow: Right Lateral epicondyle and common extensor tendon origin visualized. Patient does have edema noted as well as what appears to be a a large healing tear with significant amount of scar tissue. This appears to have affected approximately 85% of the tendon. No avulsion noted. Radial head unremarkable and located in annular ligament  Medial epicondyle and common flexor tendon origin visualized.  No edema, effusions, or avulsions seen. Ulnar nerve in cubital tunnel  unremarkable. Olecranon and triceps insertion visualized and unremarkable without edema, effusion, or avulsion.  No signs olecranon bursitis. Power doppler signal normal.  IMPRESSION:  Common extensor tendon tear with scarring  .  Procedure note 97110; 15 minutes spent for Therapeutic exercises as stated in above notes.  This included exercises focusing on stretching, strengthening, with significant focus on eccentric aspects.   Proper technique shown and discussed handout in great detail with ATC.  All questions were discussed and answered.     Impression and Recommendations:     This case required medical decision making of moderate complexity.

## 2014-12-26 ENCOUNTER — Encounter: Payer: Self-pay | Admitting: Family Medicine

## 2014-12-26 ENCOUNTER — Ambulatory Visit (INDEPENDENT_AMBULATORY_CARE_PROVIDER_SITE_OTHER): Payer: 59 | Admitting: Family Medicine

## 2014-12-26 VITALS — BP 110/70 | HR 59 | Ht 71.0 in | Wt 227.0 lb

## 2014-12-26 DIAGNOSIS — M7711 Lateral epicondylitis, right elbow: Secondary | ICD-10-CM

## 2014-12-26 NOTE — Progress Notes (Signed)
[name] [address] [place], [place] [address] Phone: [phone] Subjective:     CC: Right shoulder and elbow pain follow up  Andre Morgan is a 44 y.o. male coming in with complaint of right shoulder and elbow pain. Patient was having more of a lateral epicondylitis and did have a tear in the common extensor tendon. Patient has been doing conservative therapy much more religiously including the home exercises, bracing, as well as anti-inflammatories. Patient has limited the amount of lifting at this time. Patient is feeling significantly better. Patient states that he is approximately 45-50% better. Patient denies any weakness. States that overall he can do more activities.  Shoulder pain is completely resolved.     Past medical history, social, surgical and family history all reviewed in electronic medical record.   Review of Systems: No headache, visual changes, nausea, vomiting, diarrhea, constipation, dizziness, abdominal pain, skin rash, fevers, chills, night sweats, weight loss, swollen lymph nodes, body aches, joint swelling, muscle aches, chest pain, shortness of breath, mood changes.   Objective Blood pressure 110/70, pulse 59, height  (1.803 m), weight 227 lb (102.967 kg), SpO2 99 %.  General: No apparent distress alert and oriented x3 mood and affect normal, dressed appropriately.  HEENT: Pupils equal, extraocular movements intact  Respiratory: Patient's speak in full sentences and does not appear short of breath  Cardiovascular: No lower extremity edema, non tender, no erythema  Skin: Warm dry intact with no signs of infection or rash on extremities or on axial skeleton.  Abdomen: Soft nontender  Neuro: Cranial nerves II through XII are intact, neurovascularly intact in all extremities with 2+ DTRs and 2+ pulses.  Lymph: No lymphadenopathy of posterior or anterior cervical chain or axillae bilaterally.  Gait normal  with good balance and coordination.  MSK:  Non tender with full range of motion and good stability and symmetric strength and tone of  wrist, hip, knee and ankles bilaterally.  Shoulder: Right Inspection reveals no abnormalities, atrophy or asymmetry. Palpation is normal with no tenderness over AC joint or bicipital groove. ROM is full in all planes. Rotator cuff strength normal throughout. No signs of impingement with negative Neer and Hawkin's tests, empty can sign. Speeds and Yergason's tests normal. No labral pathology noted with negative Obrien's, negative clunk and good stability. Normal scapular function observed. No painful arc and no drop arm sign. No apprehension sign    Elbow: Right Unremarkable to inspection. Range of motion full pronation, supination, flexion, extension. Strength is full to all of the above directions Stable to varus, valgus stress. Negative moving valgus stress test. Tender over the lateral epicondylitis and pain with resisted extension of the wrist and the same vicinity but significantly less painful than previous exam. no weakness noted  Ulnar nerve does not sublux. Negative cubital tunnel Tinel's. Contralateral elbow unremarkable  Musculoskeletal ultrasound was performed and interpreted by Terrilee Files D.O.   Elbow: Right Lateral epicondyle and common extensor tendon origin visualized. Patient does have edema noted as well as what appears to be a a large healing tear that has approximately 50-50% more healing than previous exam.  Radial head unremarkable and located in annular ligament Medial epicondyle and common flexor tendon origin visualized.  No edema, effusions, or avulsions seen. Ulnar nerve in cubital tunnel unremarkable. Olecranon and triceps insertion visualized and unremarkable without edema, effusion, or avulsion.  No signs olecranon bursitis. Power doppler signal normal.  IMPRESSION:  Common extensor tendon  tear with increasing  scarring  .    Impression and Recommendations:     This case required medical decision making of moderate complexity.

## 2014-12-26 NOTE — Progress Notes (Signed)
Pre visit review using our clinic review tool, if applicable. No additional management support is needed unless otherwise documented below in the visit note. 

## 2014-12-26 NOTE — Assessment & Plan Note (Signed)
Patient is doing significantly better at this time. Encourage patient to continue home exercises, icing protocol and patient will avoid any heavy lifting for another 3 weeks. We discussed again using more of a supinated holding position and avoiding any overhead activities. Patient then will continue the anti-inflammatories on an as-needed basis in and see me again in 3 weeks for further evaluation. At that point we will start patient on a strengthening exercise prescription.

## 2014-12-26 NOTE — Patient Instructions (Signed)
You are doing great You are going to heal fine.  Continue icing after lifting.  Continue meloxicam as you need it.  Continue my exercises 3 times a week See me in 3 weeks and we will get you lifting again safely.

## 2015-01-08 ENCOUNTER — Telehealth: Payer: Self-pay | Admitting: Internal Medicine

## 2015-01-08 MED ORDER — VALACYCLOVIR HCL 1 G PO TABS: 1000.0000 mg | ORAL_TABLET | Freq: Two times a day (BID) | ORAL | Status: DC

## 2015-01-08 NOTE — Telephone Encounter (Signed)
Notified pt refill has been sent must see md for additional refills...Raechel Chute/lmb

## 2015-01-08 NOTE — Telephone Encounter (Signed)
Pt called in requesting refill on his valACYclovir (VALTREX) 1000 MG tablet [[id number]][[id number]]  [facility] to [facility]

## 2015-01-11 ENCOUNTER — Ambulatory Visit: Payer: 59 | Admitting: Internal Medicine

## 2015-02-05 ENCOUNTER — Telehealth: Payer: Self-pay | Admitting: Family Medicine

## 2015-02-05 MED ORDER — MELOXICAM 15 MG PO TABS: 15.0000 mg | ORAL_TABLET | Freq: Every day | ORAL | Status: DC

## 2015-02-05 NOTE — Telephone Encounter (Signed)
Patient is requesting ref[facility] on [facility].

## 2015-02-05 NOTE — Telephone Encounter (Signed)
Refill done.  

## 2015-10-22 ENCOUNTER — Ambulatory Visit (INDEPENDENT_AMBULATORY_CARE_PROVIDER_SITE_OTHER): Payer: Commercial Managed Care - PPO | Admitting: Psychology

## 2015-10-22 DIAGNOSIS — F331 Major depressive disorder, recurrent, moderate: Secondary | ICD-10-CM | POA: Diagnosis not present

## 2015-11-05 ENCOUNTER — Ambulatory Visit: Payer: Commercial Managed Care - PPO | Admitting: Podiatry

## 2015-11-13 ENCOUNTER — Ambulatory Visit: Payer: Commercial Managed Care - PPO | Admitting: Psychology

## 2015-12-16 MED FILL — MUPIROCIN 2% OINTMENT: 2 | 5 days supply | Qty: 22 | Fill #0

## 2016-03-04 ENCOUNTER — Telehealth: Payer: Self-pay | Admitting: *Deleted

## 2016-03-04 NOTE — Telephone Encounter (Signed)
Received call pt requesting refill on viagra. Inform pt per chart he is due for CPX last saw MD 12/2013. Made appt for next tues 03/10/16 @11 :00...Raechel Chute/lmb

## 2016-03-10 ENCOUNTER — Ambulatory Visit (INDEPENDENT_AMBULATORY_CARE_PROVIDER_SITE_OTHER): Payer: Commercial Managed Care - PPO | Admitting: Internal Medicine

## 2016-03-10 ENCOUNTER — Other Ambulatory Visit (INDEPENDENT_AMBULATORY_CARE_PROVIDER_SITE_OTHER): Payer: Commercial Managed Care - PPO

## 2016-03-10 ENCOUNTER — Encounter: Payer: Self-pay | Admitting: Internal Medicine

## 2016-03-10 VITALS — BP 118/76 | HR 60 | Temp 97.9°F | Resp 20 | Wt 244.0 lb

## 2016-03-10 DIAGNOSIS — R7309 Other abnormal glucose: Secondary | ICD-10-CM

## 2016-03-10 DIAGNOSIS — N529 Male erectile dysfunction, unspecified: Secondary | ICD-10-CM

## 2016-03-10 DIAGNOSIS — Z0001 Encounter for general adult medical examination with abnormal findings: Secondary | ICD-10-CM

## 2016-03-10 DIAGNOSIS — R6889 Other general symptoms and signs: Secondary | ICD-10-CM | POA: Diagnosis not present

## 2016-03-10 DIAGNOSIS — R32 Unspecified urinary incontinence: Secondary | ICD-10-CM

## 2016-03-10 LAB — CBC WITH DIFFERENTIAL/PLATELET
BASOS ABS: 0 10*3/uL (ref 0.0–0.1)
Basophils Relative: 0.5 % (ref 0.0–3.0)
EOS ABS: 0.1 10*3/uL (ref 0.0–0.7)
Eosinophils Relative: 1.7 % (ref 0.0–5.0)
HCT: 40.3 % (ref 39.0–52.0)
Hemoglobin: 12.8 g/dL — ABNORMAL LOW (ref 13.0–17.0)
LYMPHS ABS: 1.2 10*3/uL (ref 0.7–4.0)
Lymphocytes Relative: 24 % (ref 12.0–46.0)
MCHC: 31.8 g/dL (ref 30.0–36.0)
MCV: 70.9 fl — ABNORMAL LOW (ref 78.0–100.0)
MONO ABS: 0.4 10*3/uL (ref 0.1–1.0)
Monocytes Relative: 7.5 % (ref 3.0–12.0)
NEUTROS ABS: 3.4 10*3/uL (ref 1.4–7.7)
NEUTROS PCT: 66.3 % (ref 43.0–77.0)
PLATELETS: 252 10*3/uL (ref 150.0–400.0)
RBC: 5.69 Mil/uL (ref 4.22–5.81)
RDW: 15.2 % (ref 11.5–15.5)
WBC: 5.2 10*3/uL (ref 4.0–10.5)

## 2016-03-10 LAB — HEPATIC FUNCTION PANEL
ALK PHOS: 52 U/L (ref 39–117)
ALT: 34 U/L (ref 0–53)
AST: 45 U/L — ABNORMAL HIGH (ref 0–37)
Albumin: 4.2 g/dL (ref 3.5–5.2)
BILIRUBIN DIRECT: 0.1 mg/dL (ref 0.0–0.3)
TOTAL PROTEIN: 7.2 g/dL (ref 6.0–8.3)
Total Bilirubin: 0.6 mg/dL (ref 0.2–1.2)

## 2016-03-10 LAB — BASIC METABOLIC PANEL
BUN: 21 mg/dL (ref 6–23)
CHLORIDE: 100 meq/L (ref 96–112)
CO2: 29 meq/L (ref 19–32)
Calcium: 9.4 mg/dL (ref 8.4–10.5)
Creatinine, Ser: 1.34 mg/dL (ref 0.40–1.50)
GFR: 74.73 mL/min (ref >60.00)
GLUCOSE: 101 mg/dL — AB (ref 70–99)
Potassium: 4.2 mEq/L (ref 3.5–5.1)
SODIUM: 138 meq/L (ref 135–145)

## 2016-03-10 LAB — LIPID PANEL
CHOLESTEROL: 200 mg/dL (ref 0–200)
HDL: 68.7 mg/dL (ref >39.00)
LDL Cholesterol: 120 mg/dL — ABNORMAL HIGH (ref 0–99)
NONHDL: 131.78
Total CHOL/HDL Ratio: 3
Triglycerides: 61 mg/dL (ref 0.0–149.0)
VLDL: 12.2 mg/dL (ref 0.0–40.0)

## 2016-03-10 LAB — URINALYSIS, ROUTINE W REFLEX MICROSCOPIC
BILIRUBIN URINE: NEGATIVE
Hgb urine dipstick: NEGATIVE
KETONES UR: NEGATIVE
Leukocytes, UA: NEGATIVE
Nitrite: NEGATIVE
PH: 6 (ref 5.0–8.0)
RBC / HPF: NONE SEEN (ref 0–?)
Specific Gravity, Urine: 1.015 (ref 1.000–1.030)
TOTAL PROTEIN, URINE-UPE24: NEGATIVE
UROBILINOGEN UA: 0.2 (ref 0.0–1.0)
Urine Glucose: NEGATIVE

## 2016-03-10 LAB — PSA: PSA: 0.57 ng/mL (ref 0.10–4.00)

## 2016-03-10 LAB — HEMOGLOBIN A1C: HEMOGLOBIN A1C: 6.1 % (ref 4.6–6.5)

## 2016-03-10 LAB — TSH: TSH: 3.97 u[IU]/mL (ref 0.35–4.50)

## 2016-03-10 MED ORDER — VALACYCLOVIR HCL 1 G PO TABS: 1000.0000 mg | ORAL_TABLET | Freq: Two times a day (BID) | ORAL | Status: DC

## 2016-03-10 MED ORDER — SILDENAFIL CITRATE 20 MG PO TABS: ORAL_TABLET | ORAL | Status: DC

## 2016-03-10 MED FILL — valACYclovir HCL 1 GM TABS: 1 | 7 days supply | Qty: 14 | Fill #0

## 2016-03-10 NOTE — Assessment & Plan Note (Signed)

## 2016-03-10 NOTE — Assessment & Plan Note (Addendum)
Mild worsening, for viagra prn,  to f/u any worsening symptoms or concerns  In addition to the time spent performing CPE, I spent an additional 40 minutes face to face,in which greater than 50% of this time was spent in counseling and coordination of care for patient's acute illness as documented.

## 2016-03-10 NOTE — Assessment & Plan Note (Signed)
Asympt, stable overall by history and exam, recent data reviewed with pt, and pt to continue medical treatment as before,  to f/u any worsening symptoms or concerns Lab Results  Component Value Date   HGBA1C 6.0 08/17/2013   For f/u a1c

## 2016-03-10 NOTE — Assessment & Plan Note (Signed)
Unusual presentation, exam benign, afeb, ? Stricture related, for UA, also refer urology

## 2016-03-10 NOTE — Progress Notes (Signed)
Pre visit review using our clinic review tool, if applicable. No additional management support is needed unless otherwise documented below in the visit note. 

## 2016-03-10 NOTE — Progress Notes (Signed)
Subjective:    Patient ID: Andre Morgan, male    DOB: 02-13-1971, 45 y.o.   MRN: [id number]  HPI  Here for wellness and f/u;  Overall doing ok;  Pt denies Chest pain, worsening SOB, DOE, wheezing, orthopnea, PND, worsening LE edema, palpitations, dizziness or syncope.  Pt denies neurological change such as new headache, facial or extremity weakness.  Pt denies polydipsia, polyuria, or low sugar symptoms. Pt states overall good compliance with treatment and medications, good tolerability, and has been trying to follow appropriate diet.  Pt denies worsening depressive symptoms, suicidal ideation or panic. No fever, night sweats, wt loss, loss of appetite, or other constitutional symptoms.  Pt states good ability with ADL's, has low fall risk, home safety reviewed and adequate, no other significant changes in hearing or vision, and very active with exercise and lifting wts..  Denies urinary symptoms such as dysuria, frequency, urgency, flank pain, hematuria or n/v, fever, chills, except for "irreg stream" and seems to spray, occas minor incontinence, drinks lots of fluids which can make it worse.  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  Wife is also a pt here.  Having worsening ED symptoms x 6 mo, not stress related it seems, asks for viagra.   Past Medical History  Diagnosis Date  . Bipolar disorder (HCC) 12/05/2011  . Alcohol abuse 12/05/2011  . 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 Depression   . Erectile dysfunction 12/09/2011  . 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  reports that he has quit smoking. He does not have any smokeless tobacco history on file. He reports that he does not drink alcohol or use illicit drugs. family history is not on file. No Known Allergies Current Outpatient Prescriptions on File Prior to Visit  Medication Sig Dispense Refill  . valACYclovir (VALTREX) 1000 MG tablet Take 1 tablet (1,000 mg total) by mouth 2 (two) times daily. As needed for cold sores  14 tablet 0   No current facility-administered medications on file prior to visit.   Review of Systems Constitutional: Negative for increased diaphoresis, or other activity, appetite or siginficant weight change other than noted HENT: Negative for worsening hearing loss, ear pain, facial swelling, mouth sores and neck stiffness.   Eyes: Negative for other worsening pain, redness or visual disturbance.  Respiratory: Negative for choking or stridor Cardiovascular: Negative for other chest pain and palpitations.  Gastrointestinal: Negative for worsening diarrhea, blood in stool, or abdominal distention Genitourinary: Negative for hematuria, flank pain or change in urine volume.  Musculoskeletal: Negative for myalgias or other joint complaints.  Skin: Negative for other color change and wound or drainage.  Neurological: Negative for syncope and numbness. other than noted Hematological: Negative for adenopathy. or other swelling Psychiatric/Behavioral: Negative for hallucinations, SI, self-injury, decreased concentration or other worsening agitation.      Objective:   Physical Exam BP 118/76 mmHg  Pulse 60  Temp(Src) 97.9 F (36.6 C) (Oral)  Resp 20  Wt 244 lb (110.678 kg)  SpO2 98% VS noted,  Constitutional: Pt is oriented to person, place, and time. Appears well-developed and well-nourished, in no significant distress Head: Normocephalic and atraumatic  Eyes: Conjunctivae and EOM are normal. Pupils are equal, round, and reactive to light Right Ear: External ear normal.  Left Ear: External ear normal Nose: Nose normal.  Mouth/Throat: Oropharynx is clear and moist  Neck: Normal range of motion. Neck supple. No JVD present. No tracheal deviation present or significant  neck LA or mass Cardiovascular: Normal rate, regular rhythm, normal heart sounds and intact distal pulses.   Pulmonary/Chest: Effort normal and breath sounds without rales or wheezing  Abdominal: Soft. Bowel sounds are  normal. NT. No HSM  Musculoskeletal: Normal range of motion. Exhibits no edema Lymphadenopathy: Has no cervical adenopathy.  Neurological: Pt is alert and oriented to person, place, and time. Pt has normal reflexes. No cranial nerve deficit. Motor grossly intact Skin: Skin is warm and dry. No rash noted or new ulcers Psychiatric:  Has normal mood and affect. Behavior is normal.      Assessment & Plan:

## 2016-03-10 NOTE — Patient Instructions (Signed)
Please take all new medication as prescribed - the generic viagra  Please continue all other medications as before, and refills have been done if requested.  Please have the pharmacy call with any other refills you may need.  Please continue your efforts at being more active, low cholesterol diet, and weight control.  You are otherwise up to date with prevention measures today.  You will be contacted regarding the referral for: urology  Please keep your appointments with your specialists as you may have planned  Please go to the LAB in the Basement (turn left off the elevator) for the tests to be done today  You will be contacted by phone if any changes need to be made immediately.  Otherwise, you will receive a letter about your results with an explanation, but please check with MyChart first.  Please remember to sign up for MyChart if you have not done so, as this will be important to you in the future with finding out test results, communicating by private email, and scheduling acute appointments online when needed.  Please return in 1 year for your yearly visit, or sooner if needed, with Lab testing done 3-5 days before

## 2016-03-19 ENCOUNTER — Encounter: Payer: Self-pay | Admitting: Internal Medicine

## 2016-04-22 ENCOUNTER — Encounter: Payer: Self-pay | Admitting: Family

## 2016-04-22 ENCOUNTER — Ambulatory Visit (INDEPENDENT_AMBULATORY_CARE_PROVIDER_SITE_OTHER)
Admission: RE | Admit: 2016-04-22 | Discharge: 2016-04-22 | Disposition: A | Discharge status: Home / Self Care | Payer: Commercial Managed Care - PPO | Source: Ambulatory Visit | Attending: Family | Admitting: Family

## 2016-04-22 ENCOUNTER — Ambulatory Visit (INDEPENDENT_AMBULATORY_CARE_PROVIDER_SITE_OTHER): Payer: Commercial Managed Care - PPO | Admitting: Family

## 2016-04-22 ENCOUNTER — Telehealth: Payer: Self-pay | Admitting: Family Medicine

## 2016-04-22 VITALS — BP 118/68 | HR 68 | Resp 16 | Ht 71.0 in | Wt 243.0 lb

## 2016-04-22 DIAGNOSIS — M25512 Pain in left shoulder: Secondary | ICD-10-CM

## 2016-04-22 MED ORDER — TRAMADOL HCL 50 MG PO TABS: 50.0000 mg | ORAL_TABLET | Freq: Three times a day (TID) | ORAL | Status: DC | PRN

## 2016-04-22 MED ORDER — NAPROXEN-ESOMEPRAZOLE 500-20 MG PO TBEC: 1.0000 | DELAYED_RELEASE_TABLET | Freq: Two times a day (BID) | ORAL | Status: DC | PRN

## 2016-04-22 MED FILL — traMADol HCL 50 MG TABS: 50 | 14 days supply | Qty: 40 | Fill #0

## 2016-04-22 NOTE — Telephone Encounter (Signed)
Left message

## 2016-04-22 NOTE — Progress Notes (Signed)
Subjective:    Patient ID: Andre Morgan, male    DOB: 01-24-1971, 45 y.o.   MRN: [id number]  Chief Complaint  Patient presents with  . Shoulder Pain    left shoulder pain under armpit area, starting yesterday, feels like he may have torn a muscle    HPI:  Andre Morgan is a 45 y.o. male who  has a past medical history of Bipolar disorder (HCC) (12/05/2011); Alcohol abuse (12/05/2011); History of positive PPD (12/08/2011); Depression; Erectile dysfunction (12/09/2011); and Recurrent cold sores (12/09/2011). and presents today for an acute office visit.   This is a new problem. Associated symptom of pain located in his left shoulder in the axilla started about 24 hours ago while he was deadlifting when he felt a tearing sensation in his left shoulder. Pain is described as achy at rest and sharp with motion. Originally with numbness and tingling. ROM above 90 degrees of abduction. Unable to grab and pull. Modifying factors include Aleve which did help a little. Denies any previous injury to the left shoulder.     No Known Allergies   No current outpatient prescriptions on file prior to visit.   No current facility-administered medications on file prior to visit.     No past surgical history on file.   Past Medical History  Diagnosis Date  . Bipolar disorder (HCC) 12/05/2011  . Alcohol abuse 12/05/2011  . 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 Depression   . Erectile dysfunction 12/09/2011  . 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  Musculoskeletal:       Positive for left shoulder/thoracic pain.  Neurological: Positive for weakness. Negative for numbness.      Objective:    BP 118/68 mmHg  Pulse 68  Resp 16  Ht  (1.803 m)  Wt 243 lb (110.224 kg)  BMI 33.91 kg/m2  SpO2 99% Nursing note and vital signs reviewed.  Physical Exam  Constitutional: He is oriented to person, place, and time. He appears well-developed  and well-nourished. No distress.  Cardiovascular: Normal rate, regular rhythm, normal heart sounds and intact distal pulses.   Pulmonary/Chest: Effort normal and breath sounds normal.  Musculoskeletal:  Left shoulder - There is edema and deformity with no discoloration. Tenderness elicited over posterior aspect of humerus near insertion of middle trapezius and a mass located around the latissimus dorsi. Range of of motion is limited above 90 degrees of abduction and flexion with significant weakness noted adduction and pulling motions. Hawkins-Kennedy is negative; Empty Can is negative; Neer's impingement is negative. Distal pulses and sensation are intact and appropriate.   Neurological: He is alert and oriented to person, place, and time.  Skin: Skin is warm and dry.  Psychiatric: He has a normal mood and affect. His behavior is normal. Judgment and thought content normal.       Assessment & Plan:   Problem List Items Addressed This Visit      Other   Left shoulder pain - Primary    Symptoms and exam concerning for possible latissimus or middle trapezius tear. Obtain MRI of the left shoulder and thoracic spine. Obtain x-rays of the left rib and scapula to rule out fracture. Treat conservatively with ice and Vimovo. Start Tramadol as needed for pain not controlled with Vimovo. Follow up pending x-ray and MRI results.       Relevant Medications   traMADol (ULTRAM) 50 MG tablet  Naproxen-Esomeprazole 500-20 MG TBEC   Other Relevant Orders   MR Thoracic Spine Wo Contrast   MR Shoulder Left Wo Contrast   DG Shoulder Left (Completed)   DG Ribs Unilateral Left (Completed)       I have discontinued Mr. Faeth's valACYclovir and sildenafil. I am also having him start on traMADol. Additionally, I am having him maintain his Naproxen-Esomeprazole.   Meds ordered this encounter  Medications  . DISCONTD: Naproxen-Esomeprazole 500-20 MG TBEC    Sig: Take 1 tablet by mouth 2 (two) times  daily as needed.    Dispense:  60 tablet    Refill:  0    Order Specific Question:  Supervising Provider    Answer:  Hillard DankerRAWFORD, ELIZABETH A [[id number]]  . traMADol (ULTRAM) 50 MG tablet    Sig: Take 1 tablet (50 mg total) by mouth every 8 (eight) hours as needed.    Dispense:  40 tablet    Refill:  0    Order Specific Question:  Supervising Provider    Answer:  Hillard DankerRAWFORD, ELIZABETH A [[id number]]  . Naproxen-Esomeprazole 500-20 MG TBEC    Sig: Take 1 tablet by mouth 2 (two) times daily as needed.    Dispense:  60 tablet    Refill:  0    Order Specific Question:  Supervising Provider    Answer:  Hillard DankerRAWFORD, ELIZABETH A [[id number]]     Follow-up: Return if symptoms worsen or fail to improve.  Jeanine Luzalone, Gregory, FNP

## 2016-04-22 NOTE — Telephone Encounter (Signed)
If greg is available may be better Otherwise we can do tomorrow at 1145 but tell him may be running behind because we are doubled book

## 2016-04-22 NOTE — Patient Instructions (Signed)
Thank you for choosing [facility].  Summary/Instructions:  Ice 3-4 times per day and as needed.  They will call to schedule your MRI  Vimovo - 2x per day for inflammation  Tramadol - for pain uncontrolled with Vimovo.  Your prescription(s) have been submitted to your pharmacy or been printed and provided for you. Please take as directed and contact our office if you believe you are having problem(s) with the medication(s) or have any questions.  Please stop by radiology on the basement level of the building for your x-rays. Your results will be released to MyChart (or called to you) after review, usually within 72 hours after test completion. If any treatments or changes are necessary, you will be notified at that same time.  If your symptoms worsen or fail to improve, please contact our office for further instruction, or in case of emergency go directly to the emergency room at the closest medical facility.

## 2016-04-22 NOTE — Progress Notes (Signed)
Pre visit review using our clinic review tool, if applicable. No additional management support is needed unless otherwise documented below in the visit note. 

## 2016-04-22 NOTE — Assessment & Plan Note (Signed)
Symptoms and exam concerning for possible latissimus or middle trapezius tear. Obtain MRI of the left shoulder and thoracic spine. Obtain x-rays of the left rib and scapula to rule out fracture. Treat conservatively with ice and Vimovo. Start Tramadol as needed for pain not controlled with Vimovo. Follow up pending x-ray and MRI results.

## 2016-04-22 NOTE — Telephone Encounter (Signed)
Patient is confident that he tore a muscle in his shoulder/ back area, needs to be seen asap. No availability. Please review and contact the patient asap ,.

## 2016-05-07 ENCOUNTER — Ambulatory Visit
Admission: RE | Admit: 2016-05-07 | Discharge: 2016-05-07 | Disposition: A | Discharge status: Home / Self Care | Payer: Commercial Managed Care - PPO | Source: Ambulatory Visit | Attending: Family | Admitting: Family

## 2016-05-07 DIAGNOSIS — M25512 Pain in left shoulder: Secondary | ICD-10-CM

## 2016-05-15 ENCOUNTER — Telehealth: Payer: Self-pay

## 2016-05-15 DIAGNOSIS — S46912D Strain of unspecified muscle, fascia and tendon at shoulder and upper arm level, left arm, subsequent encounter: Secondary | ICD-10-CM

## 2016-05-15 DIAGNOSIS — S29012D Strain of muscle and tendon of back wall of thorax, subsequent encounter: Secondary | ICD-10-CM

## 2016-05-15 NOTE — Telephone Encounter (Signed)
Please send referral for therapy for the muscle tears in his back.

## 2016-05-18 NOTE — Telephone Encounter (Signed)
Referral sent 

## 2016-06-04 ENCOUNTER — Ambulatory Visit: Payer: Commercial Managed Care - PPO | Admitting: Physical Therapy

## 2016-06-11 ENCOUNTER — Ambulatory Visit (INDEPENDENT_AMBULATORY_CARE_PROVIDER_SITE_OTHER): Payer: Commercial Managed Care - PPO | Admitting: Internal Medicine

## 2016-06-11 ENCOUNTER — Encounter: Payer: Self-pay | Admitting: Internal Medicine

## 2016-06-11 DIAGNOSIS — M5442 Lumbago with sciatica, left side: Secondary | ICD-10-CM | POA: Diagnosis not present

## 2016-06-11 DIAGNOSIS — M545 Low back pain, unspecified: Secondary | ICD-10-CM | POA: Insufficient documentation

## 2016-06-11 MED ORDER — CYCLOBENZAPRINE HCL 5 MG PO TABS: 5.0000 mg | ORAL_TABLET | Freq: Three times a day (TID) | ORAL | 1 refills | Status: DC | PRN

## 2016-06-11 MED ORDER — PREDNISONE 10 MG PO TABS: ORAL_TABLET | ORAL | 0 refills | Status: DC
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
## 2016-06-11 NOTE — Patient Instructions (Addendum)
Please take all new medication as prescribed - the muscle relaxer and prednison  Please continue all other medications as before, and refills have been done if requested.  Please have the pharmacy call with any other refills you may need.  Please keep your appointments with your specialists as you may have planned

## 2016-06-11 NOTE — Progress Notes (Signed)
Pre visit review using our clinic review tool, if applicable. No additional management support is needed unless otherwise documented below in the visit note. 

## 2016-06-11 NOTE — Assessment & Plan Note (Signed)
Mild to mod, c/w msk strain with sciatica, has tramadol at home, ok for flexeril and predpac as well, no neuro changes so can hold on imagine at this time, to f/u any worsening symptoms or concerns
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
## 2016-06-15 ENCOUNTER — Encounter: Payer: Self-pay | Admitting: Physical Therapy

## 2016-06-15 ENCOUNTER — Ambulatory Visit (INDEPENDENT_AMBULATORY_CARE_PROVIDER_SITE_OTHER): Payer: Commercial Managed Care - PPO | Admitting: Family Medicine

## 2016-06-15 ENCOUNTER — Telehealth: Payer: Self-pay | Admitting: Emergency Medicine

## 2016-06-15 ENCOUNTER — Ambulatory Visit (INDEPENDENT_AMBULATORY_CARE_PROVIDER_SITE_OTHER)
Admission: RE | Admit: 2016-06-15 | Discharge: 2016-06-15 | Disposition: A | Discharge status: Home / Self Care | Payer: Commercial Managed Care - PPO | Source: Ambulatory Visit | Attending: Family Medicine | Admitting: Family Medicine

## 2016-06-15 ENCOUNTER — Ambulatory Visit: Payer: Commercial Managed Care - PPO | Attending: Family | Admitting: Physical Therapy

## 2016-06-15 ENCOUNTER — Encounter: Payer: Self-pay | Admitting: Family Medicine

## 2016-06-15 VITALS — BP 116/64 | HR 82 | Ht 71.0 in | Wt 247.0 lb

## 2016-06-15 DIAGNOSIS — M545 Low back pain, unspecified: Secondary | ICD-10-CM

## 2016-06-15 DIAGNOSIS — M6281 Muscle weakness (generalized): Secondary | ICD-10-CM | POA: Diagnosis present

## 2016-06-15 DIAGNOSIS — M25512 Pain in left shoulder: Secondary | ICD-10-CM

## 2016-06-15 DIAGNOSIS — M5442 Lumbago with sciatica, left side: Secondary | ICD-10-CM

## 2016-06-15 MED ORDER — GABAPENTIN 100 MG PO CAPS: 200.0000 mg | ORAL_CAPSULE | Freq: Every day | ORAL | 3 refills | Status: DC

## 2016-06-15 MED FILL — GABAPENTIN 100 MG CAPSULE: 100 | 30 days supply | Qty: 60 | Fill #0

## 2016-06-15 MED FILL — predniSONE 10 MG TABS: 10 | 9 days supply | Qty: 18 | Fill #0

## 2016-06-15 MED FILL — CYCLOBENZAPRINE 5 MG TABLET: 5 | 13 days supply | Qty: 40 | Fill #0

## 2016-06-15 NOTE — Telephone Encounter (Signed)
rx sent to pharmacy

## 2016-06-15 NOTE — Assessment & Plan Note (Signed)
Patient is artery on prednisone. We discussed with patient about icing, home exercises. Patient given gabapentin. Patient has muscle relaxer to take if necessary as well as tramadol for breakthrough pain. We discussed with patient about home exercises. Patient has had difficulty in the past some mild back pain but nothing like this. With having the acute injury I am concern for possible iliolumbar ligament first possible fracture. Patient will come back and see me again in 2 weeks. If worsening symptoms he will call and seek medical attention sooner.  Spent  25 minutes with patient face-to-face and had greater than 50% of counseling including as described above in assessment and plan.

## 2016-06-15 NOTE — Patient Instructions (Addendum)
Good to see you  Xray downstairs Ice 20 minutes 2 times daily. Usually after activity and before bed. Exercises 3 times a week.  pennsaid pinkie amount topically 2 times daily as needed.  Gabapentin 100mg  at night for 3 nights and if not too groggy when you wake up then go to 200mg  at night Vitamin D 2000 IU dialy  B12 1000mcg daily  B6 200mg  daily  Turmeric 500mg  twice daily  Finish the prednisone No heavy lifting for 2 weeks See me again in 2 weeks.

## 2016-06-15 NOTE — Therapy (Addendum)
Andre Morgan, [place], [address] Phone: [phone]   Fax:  [phone]  Physical Therapy Evaluation  Patient Details  Name: Andre Morgan MRN: [id number] Date of Birth: [birth year] Referring Provider: Dr Mauricio Po   Encounter Date: 06/15/2016      PT End of Session - 06/15/16 1222    Visit Number 1   Number of Visits 16   Date for PT Re-Evaluation 08/10/16   Authorization Type [employer] 60 dollar co-pay   PT Start Time 1022   PT Stop Time 1105   PT Time Calculation (min) 43 min   Activity Tolerance Patient tolerated treatment well   Behavior During Therapy [facility] for tasks assessed/performed      Past Medical History:  Diagnosis Date  . Alcohol abuse 12/05/2011  . Bipolar disorder ([place]) 12/05/2011  . Depression   . Erectile dysfunction 12/09/2011  . 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 No pertinent surgical history.  There were no vitals filed for this visit.       Subjective Assessment - 06/15/16 1031    Subjective Patient was dead lifting when he began to have pain in his shoulder. An MRI showed a small tear of his infraspinatus. He also has a strain of his lat. He has been lifting light but is not been back to doing his normal activity    Patient is accompained by: Interpreter   Pertinent History Nothing significant    Limitations Lifting   How long can you sit comfortably? N/A    How long can you stand comfortably? N/A    How long can you walk comfortably? N/A    Diagnostic tests MRI: small tear of the left infrapsinatus    Patient Stated Goals to return to lifting without pain    Currently in Pain? Yes   Pain Score 6    Pain Location Shoulder   Pain Orientation Left   Pain Descriptors / Indicators Aching   Pain Onset More than a month ago   Pain Frequency Constant   Multiple Pain Sites No            [facility] PT Assessment - 06/15/16 0001       Assessment   Medical Diagnosis Left shoulder infraspinatus tear    Referring Provider Dr Mauricio Po    Onset Date/Surgical Date 04/21/16   Hand Dominance Right   Next MD Visit No appointment    Prior Therapy No     Precautions   Precautions None     Restrictions   Weight Bearing Restrictions No     Balance Screen   Has the patient fallen in the past 6 months No     Home Environment   Additional Comments Nothing significant      Prior Function   Level of Independence Independent   Vocation Full time employment   Vocation Requirements ED tech at [employer]    Leisure Lifting      Cognition   Overall Cognitive Status Within Functional Limits for tasks assessed     Observation/Other Assessments   Focus on Therapeutic Outcomes (FOTO)  Not given by the front      Posture/Postural Control   Posture/Postural Control Postural limitations   Postural Limitations Rounded Shoulders     ROM / Strength   AROM / PROM / Strength AROM;PROM;Strength     AROM   Overall AROM Comments slight restrictions at  end range flexion and abduction but these restrictions are bilateral and likely 2nd to muslce bulk     Strength   Overall Strength Comments upper trap 5/5    Strength Assessment Site Shoulder   Right/Left Shoulder Left   Left Shoulder Flexion 4+/5   Left Shoulder ABduction 4+/5   Left Shoulder Internal Rotation 4+/5   Left Shoulder External Rotation 4+/5     Palpation   Palpation comment tenderness to palaption in hte lat                    [facility] Adult PT Treatment/Exercise - 06/15/16 0001      Exercises   Exercises Shoulder     Shoulder Exercises: Supine   Other Supine Exercises Supine ABC A-H 6 pounds; supine D2 flexion 6lb 2x10; prone row 6lb x10 prone extension 2x10 6lb; horrizontal abduction 2x10 6lb; Porone Y 2x10 6lb; wall push up2x10 T-band standing ERx10 IR x10 row x10 extesion with blue band                 PT Education -  06/15/16 1218    Education provided Yes   Education Details Patient given HEP. Educated patient on proper exercise progression    Person(s) Educated Patient   Methods Explanation          PT Short Term Goals - 06/15/16 1232      PT SHORT TERM GOAL #1   Title Patient will demsotrate 5/5 gross left upper extremity strength   Time 4   Period Weeks   Status New     PT SHORT TERM GOAL #2   Title Patient will demsotrate 5/5 scapular strength    Time 4   Period Weeks   Status New     PT SHORT TERM GOAL #3   Title Patient will report no tenderness to palpation in the lat area    Time 4   Period Weeks   Status New     PT SHORT TERM GOAL #4   Title Patient will be Independent with initial HEP    Time 4   Period Weeks   Status New           PT Long Term Goals - 06/15/16 1233      PT LONG TERM GOAL #1   Title Patient will reach overhead without increased pain in his lat area in order to perfrom ADL's    Time 8   Period Weeks   Status New     PT LONG TERM GOAL #2   Title Patient will return to full gym program without increased pain    Time 8   Period Weeks   Status New     PT LONG TERM GOAL #3   Title Patient will perfrom work tasks without significant pain   Time 8   Period Weeks   Status New               Plan - 06/15/16 1222    Clinical Impression Statement Patient is a57 year old male S/P small RTC tear when perfroming exercises at [employer]. At this time he has some lat dsoreness and scapular instability and weakness. He was given a basic HEP with education on activity progression. he will liikely come back for a few more visits for his shoulder. He was seen today for a low complexity eval.    Rehab Potential Good   PT Frequency 2x / week   PT Duration 8  weeks   PT Treatment/Interventions ADLs/Self Care Home Management;Electrical Stimulation;Cryotherapy;Iontophoresis 85m/ml Dexamethasone;Stair training;Gait training;Functional mobility  training;Orthotic Fit/Training;Patient/family education;Cognitive remediation;Neuromuscular re-education;Therapeutic exercise;Therapeutic activities;Manual techniques;Passive range of motion;Dry needling;Splinting;Taping;Visual/perceptual remediation/compensation   PT Next Visit Plan Patinet may obtain a script for his back. If not review high level scap stabilization, ball v wall, wall walks, high planks, High plank rock on bosu ball, review gym equipment    PT Home Exercise Plan Prone row, horizontal abduction, extension, Y; side lying ER, t-band ER, IR, retraction, extesnion,wall push up, lat stretch at the sink, posterior capsue stretch; overhead lat stretch    Consulted and Agree with Plan of Care Patient      Patient will benefit from skilled therapeutic intervention in order to improve the following deficits and impairments:  Decreased strength, Pain  Visit Diagnosis: Pain in left shoulder - Plan: PT plan of care cert/re-cert  Muscle weakness (generalized) - Plan: PT plan of care cert/re-cert   PHYSICAL THERAPY DISCHARGE SUMMARY  Visits from Start of Care: 1  Current functional level related to goals / functional outcomes: Patient only came for 1 visit. He had a high co-pay   Remaining deficits: Unknown    Education / Equipment: Unknown  Plan: Patient agrees to discharge.  Patient goals were not met. Patient is being discharged due to meeting the stated rehab goals.  ?????       Problem List Patient Active Problem List   Diagnosis Date Noted  . Low back pain 06/11/2016  . Left shoulder pain 04/22/2016  . Urinary incontinence 03/10/2016  . Rotator cuff tear, right 02/27/2014  . Abnormal liver function tests 02/16/2014  . Right shoulder pain 02/16/2014  . Lateral epicondylitis of right elbow 02/16/2014  . Schizoaffective disorder, bipolar type ([place] 11/24/2013  . Irregular heart beat 11/24/2013  . Elevated glucose 08/17/2013  . Ulnar neuritis 08/20/2012  . Erectile  dysfunction 12/09/2011  . 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 Encounter for preventative adult health care exam with abnormal findings 12/05/2011  . Alcohol abuse 12/05/2011    DCarney LivingPT DPT   06/15/2016, 12:46 PM  [facility] [address]Phone: [phone]  Fax:  [phone] Name: Andre SCHRIEBERMRN: [id number] of Birth: 2Nov 07, 1972

## 2016-06-15 NOTE — Telephone Encounter (Signed)
Pt wants to know if you can send his prescription gabapentin (NEURONTIN) 100 MG capsule to [facility]. Please advise thanks.

## 2016-06-17 ENCOUNTER — Encounter: Payer: Self-pay | Admitting: *Deleted

## 2016-06-17 ENCOUNTER — Telehealth: Payer: Self-pay | Admitting: Family Medicine

## 2016-06-17 DIAGNOSIS — M5416 Radiculopathy, lumbar region: Secondary | ICD-10-CM

## 2016-06-17 NOTE — Telephone Encounter (Signed)
Please follow up with patient. Patient states he is in a lot of pain and has to take pain meds at work.  States the pain med is making him groggy while at work.

## 2016-06-17 NOTE — Telephone Encounter (Signed)
lmovm for pt to return call.  

## 2016-06-17 NOTE — Telephone Encounter (Signed)
If he wants we can try to get MRI.  Prednisone should be helping.  IF he wants MRI of lumbar spine.

## 2016-06-17 NOTE — Telephone Encounter (Signed)
Spoke to pt, ordered MRI & printed letter for pt to be out of work for one week.

## 2016-06-27 ENCOUNTER — Ambulatory Visit
Admission: RE | Admit: 2016-06-27 | Discharge: 2016-06-27 | Disposition: A | Discharge status: Home / Self Care | Payer: Commercial Managed Care - PPO | Source: Ambulatory Visit | Attending: Family Medicine | Admitting: Family Medicine

## 2016-06-27 DIAGNOSIS — M5416 Radiculopathy, lumbar region: Secondary | ICD-10-CM
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
## 2016-06-30 ENCOUNTER — Ambulatory Visit (INDEPENDENT_AMBULATORY_CARE_PROVIDER_SITE_OTHER): Payer: Commercial Managed Care - PPO | Admitting: Family Medicine

## 2016-06-30 ENCOUNTER — Encounter: Payer: Self-pay | Admitting: *Deleted

## 2016-06-30 ENCOUNTER — Encounter: Payer: Self-pay | Admitting: Family Medicine

## 2016-06-30 DIAGNOSIS — M5442 Lumbago with sciatica, left side: Secondary | ICD-10-CM | POA: Diagnosis not present

## 2016-06-30 NOTE — Assessment & Plan Note (Signed)
Patient is making progress. Still complaining of some radicular symptoms. Encourage him to continue the gabapentin. We discussed the icing. Does have tramadol for breakthrough pain but has not needed it much. Patient will start with formal physical therapy and will start light duty at work again. Follow-up again in 3 weeks. As long as he is doing well hopefully we'll be able to release and at that time. Spent  25 minutes with patient face-to-face and had greater than 50% of counseling including as described above in assessment and plan.

## 2016-06-30 NOTE — Patient Instructions (Addendum)
Good to see you  Ice is your friend  Stay active star weight 30% and increase 10% a week PT will be calling you  Ice is still a good idea and continue the gabapentin  See me again in 3 weeks and we will advance you

## 2016-06-30 NOTE — Addendum Note (Signed)
Addended by: Edwena FeltyARSON, Finn Amos T on: 06/30/2016 11:13 AM   Modules accepted: Orders

## 2016-07-09 ENCOUNTER — Ambulatory Visit: Payer: Commercial Managed Care - PPO | Admitting: Family

## 2016-07-21 ENCOUNTER — Encounter: Payer: Self-pay | Admitting: *Deleted

## 2016-07-21 ENCOUNTER — Ambulatory Visit (INDEPENDENT_AMBULATORY_CARE_PROVIDER_SITE_OTHER): Payer: Commercial Managed Care - PPO | Admitting: Family Medicine

## 2016-07-21 DIAGNOSIS — M5442 Lumbago with sciatica, left side: Secondary | ICD-10-CM | POA: Diagnosis not present

## 2016-07-21 MED ORDER — PREDNISONE 50 MG PO TABS: 50.0000 mg | ORAL_TABLET | Freq: Every day | ORAL | 0 refills | Status: DC
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
## 2016-07-21 NOTE — Patient Instructions (Addendum)
Gd to see you  Andre Bryantce is your friend when you need it  Continue to work on core.  OK to start lifting but would go very light at first and increase slowly while dropping the reps at first with any weight increase.  Prednisone daily for 5 days again.  Start work on Monday and see how you do.  See me again within the next 3 weeks and we will make sure you are doing well.
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
## 2016-08-19 ENCOUNTER — Encounter: Payer: Self-pay | Admitting: Family Medicine

## 2016-08-19 ENCOUNTER — Ambulatory Visit (INDEPENDENT_AMBULATORY_CARE_PROVIDER_SITE_OTHER): Payer: Commercial Managed Care - PPO | Admitting: Family Medicine

## 2016-08-19 DIAGNOSIS — G8929 Other chronic pain: Secondary | ICD-10-CM

## 2016-08-19 DIAGNOSIS — M5442 Lumbago with sciatica, left side: Secondary | ICD-10-CM | POA: Diagnosis not present

## 2016-08-19 NOTE — Assessment & Plan Note (Signed)
Patient doing very well at this time. I believe that he has had maximal medical improvement at this time. Patient will do any type of activities and is fully release. Follow-up as needed

## 2016-08-19 NOTE — Patient Instructions (Signed)
Good to see you  We will get you the letter Listen to your body  Stay active You know where I am if you need me.

## 2016-08-20 IMAGING — DX DG RIBS 2V*L*
2 series · 2 of 2 positions shown · non-contrast
Comparison: None.

CLINICAL DATA: Left rib injury and pain beginning yesterday while
exercising. Initial encounter.

EXAM:
LEFT RIBS - 2 VIEW

[rib ap]
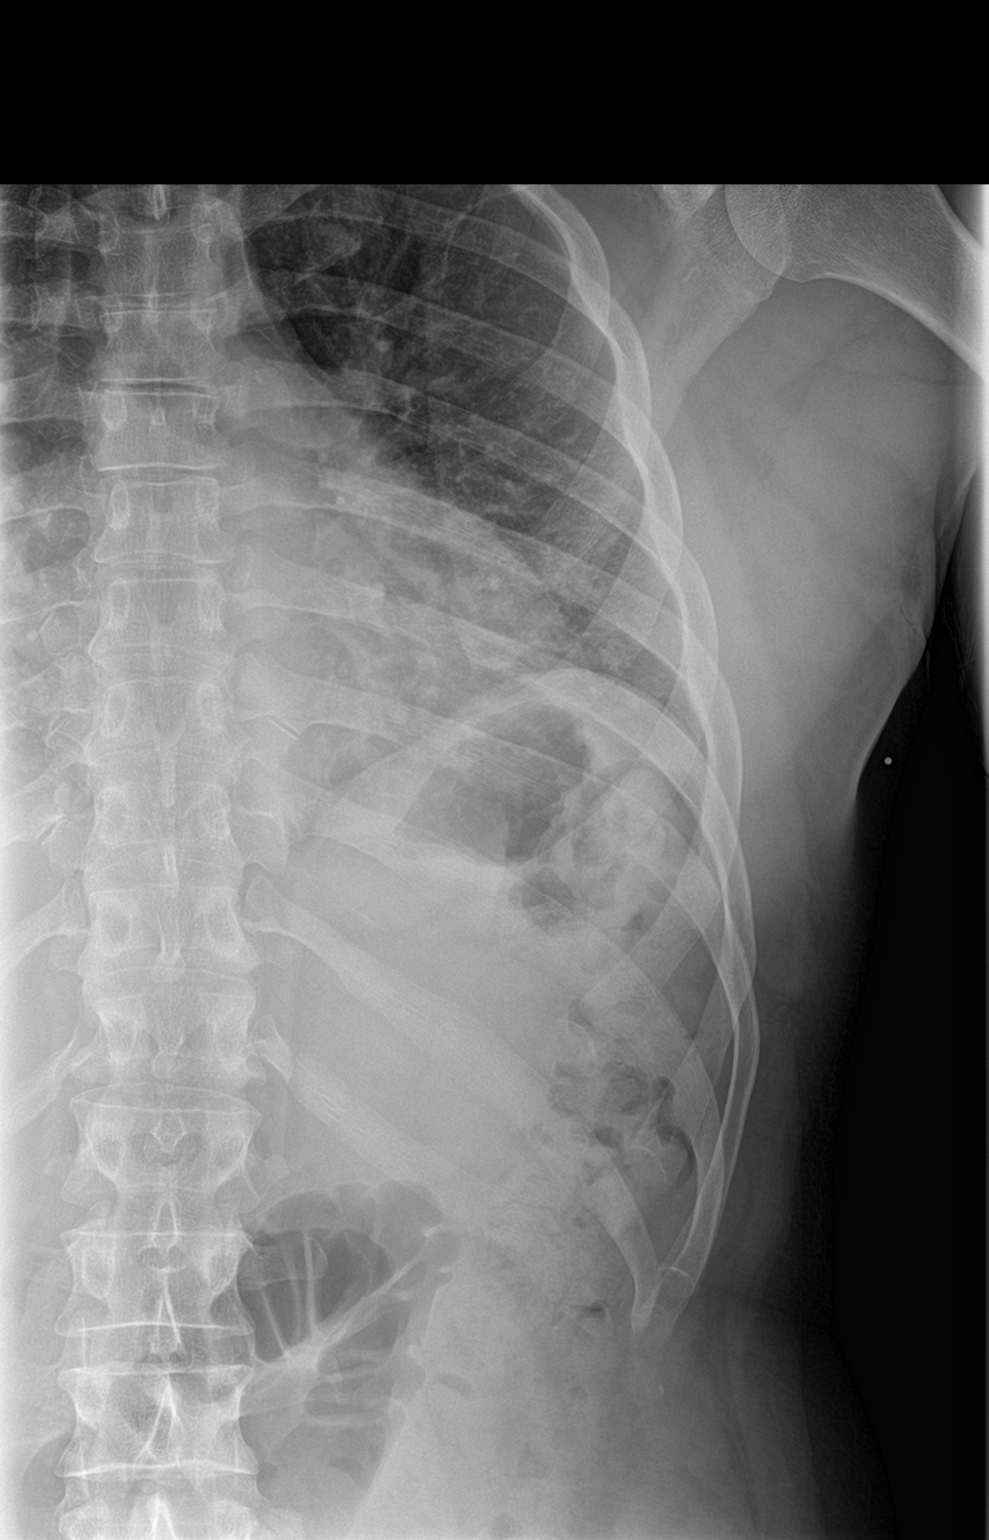

[rib obl]
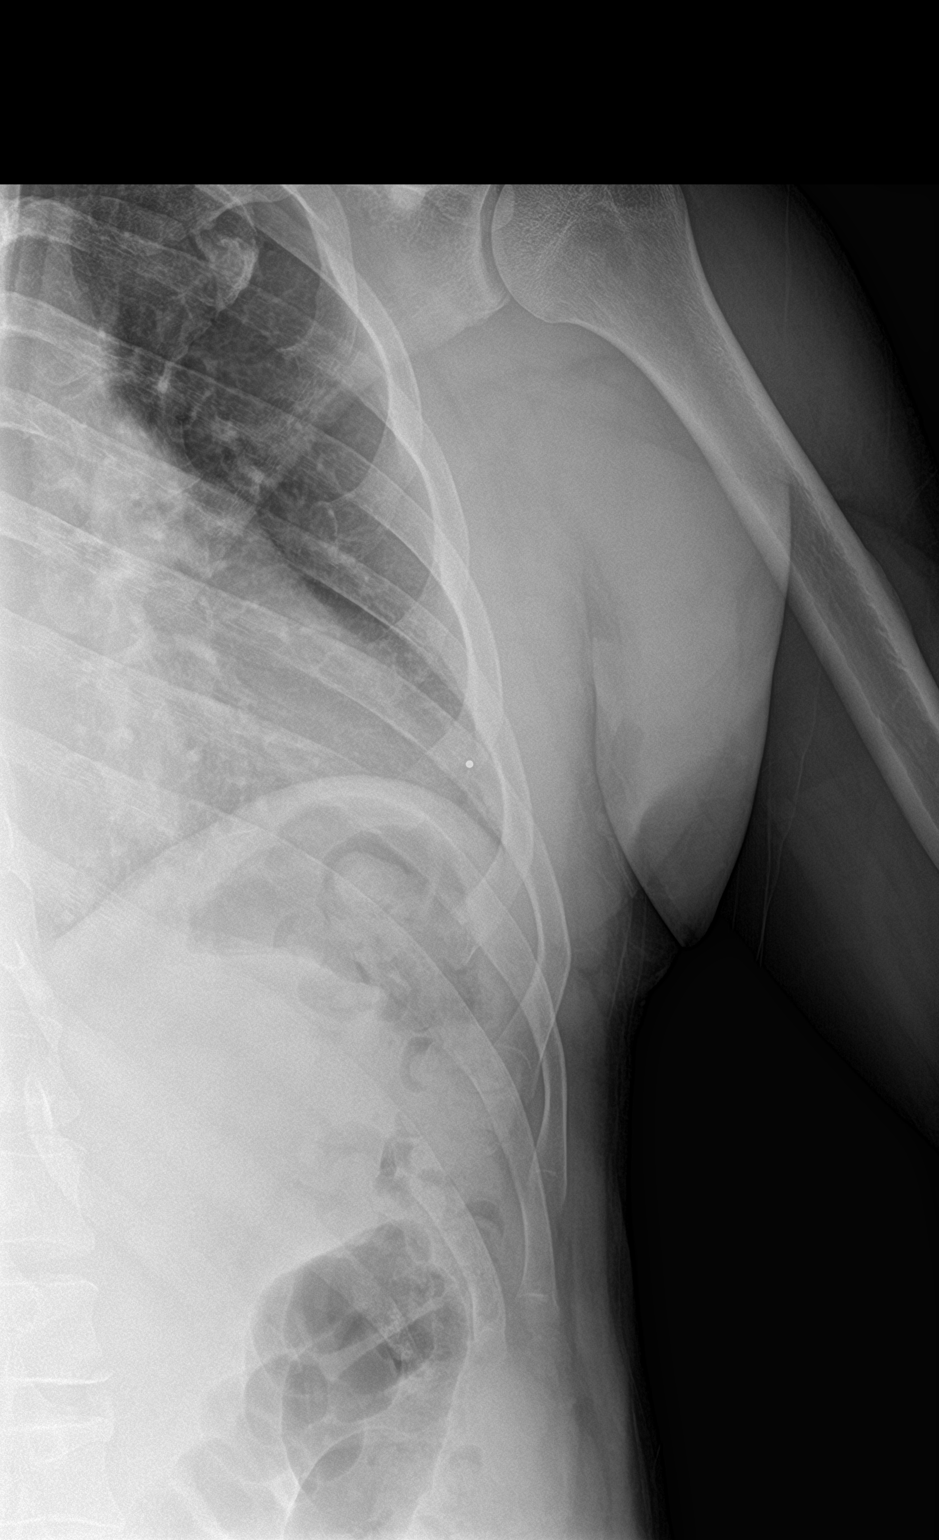

[2 of 2 positions shown; findings below may reference images not displayed]

FINDINGS: No fracture or other bone lesions are seen involving the ribs. No
left-sided pneumothorax or hemothorax visualized.
IMPRESSION: Negative.

## 2016-09-04 MED FILL — IBUPROFEN 600 MG TABLET: 600 | 4 days supply | Qty: 16 | Fill #0

## 2016-09-04 MED FILL — HYDROCODON-APAP 5-325: 5-325 | 4 days supply | Qty: 16 | Fill #0

## 2017-03-16 ENCOUNTER — Encounter: Payer: Self-pay | Admitting: Internal Medicine

## 2017-06-17 ENCOUNTER — Ambulatory Visit: Payer: Self-pay

## 2017-06-17 ENCOUNTER — Other Ambulatory Visit: Payer: Self-pay | Admitting: Occupational Medicine

## 2017-06-17 DIAGNOSIS — M79632 Pain in left forearm: Secondary | ICD-10-CM

## 2017-12-03 ENCOUNTER — Other Ambulatory Visit (INDEPENDENT_AMBULATORY_CARE_PROVIDER_SITE_OTHER): Payer: PRIVATE HEALTH INSURANCE

## 2017-12-03 ENCOUNTER — Ambulatory Visit: Payer: PRIVATE HEALTH INSURANCE | Admitting: Internal Medicine

## 2017-12-03 ENCOUNTER — Encounter: Payer: Self-pay | Admitting: Internal Medicine

## 2017-12-03 VITALS — BP 120/74 | HR 83 | Temp 98.0°F | Ht 71.0 in | Wt 221.0 lb

## 2017-12-03 DIAGNOSIS — Z0000 Encounter for general adult medical examination without abnormal findings: Secondary | ICD-10-CM

## 2017-12-03 DIAGNOSIS — Z114 Encounter for screening for human immunodeficiency virus [HIV]: Secondary | ICD-10-CM | POA: Diagnosis not present

## 2017-12-03 DIAGNOSIS — R5383 Other fatigue: Secondary | ICD-10-CM

## 2017-12-03 DIAGNOSIS — N529 Male erectile dysfunction, unspecified: Secondary | ICD-10-CM | POA: Diagnosis not present

## 2017-12-03 LAB — CBC WITH DIFFERENTIAL/PLATELET
BASOS ABS: 0 10*3/uL (ref 0.0–0.1)
BASOS PCT: 0.4 % (ref 0.0–3.0)
Eosinophils Absolute: 0.1 10*3/uL (ref 0.0–0.7)
Eosinophils Relative: 2.1 % (ref 0.0–5.0)
HEMATOCRIT: 38.2 % — AB (ref 39.0–52.0)
Hemoglobin: 12.2 g/dL — ABNORMAL LOW (ref 13.0–17.0)
LYMPHS ABS: 1.2 10*3/uL (ref 0.7–4.0)
LYMPHS PCT: 22.3 % (ref 12.0–46.0)
MCHC: 31.9 g/dL (ref 30.0–36.0)
MCV: 72 fl — ABNORMAL LOW (ref 78.0–100.0)
Monocytes Absolute: 0.4 10*3/uL (ref 0.1–1.0)
Monocytes Relative: 7.6 % (ref 3.0–12.0)
NEUTROS ABS: 3.5 10*3/uL (ref 1.4–7.7)
Neutrophils Relative %: 67.6 % (ref 43.0–77.0)
PLATELETS: 192 10*3/uL (ref 150.0–400.0)
RBC: 5.31 Mil/uL (ref 4.22–5.81)
RDW: 14.9 % (ref 11.5–15.5)
WBC: 5.2 10*3/uL (ref 4.0–10.5)

## 2017-12-03 LAB — URINALYSIS, ROUTINE W REFLEX MICROSCOPIC
BILIRUBIN URINE: NEGATIVE
HGB URINE DIPSTICK: NEGATIVE
Ketones, ur: NEGATIVE
LEUKOCYTES UA: NEGATIVE
NITRITE: NEGATIVE
Specific Gravity, Urine: 1.015 (ref 1.000–1.030)
TOTAL PROTEIN, URINE-UPE24: NEGATIVE
UROBILINOGEN UA: 0.2 (ref 0.0–1.0)
Urine Glucose: NEGATIVE
pH: 7.5 (ref 5.0–8.0)

## 2017-12-03 LAB — HEPATIC FUNCTION PANEL
ALK PHOS: 48 U/L (ref 39–117)
ALT: 31 U/L (ref 0–53)
AST: 34 U/L (ref 0–37)
Albumin: 4.2 g/dL (ref 3.5–5.2)
BILIRUBIN DIRECT: 0.1 mg/dL (ref 0.0–0.3)
Total Bilirubin: 0.6 mg/dL (ref 0.2–1.2)
Total Protein: 7.2 g/dL (ref 6.0–8.3)

## 2017-12-03 LAB — BASIC METABOLIC PANEL
BUN: 15 mg/dL (ref 6–23)
CHLORIDE: 106 meq/L (ref 96–112)
CO2: 29 meq/L (ref 19–32)
CREATININE: 1.28 mg/dL (ref 0.40–1.50)
Calcium: 9.1 mg/dL (ref 8.4–10.5)
GFR: 78.16 mL/min (ref >60.00)
GLUCOSE: 92 mg/dL (ref 70–99)
POTASSIUM: 4.1 meq/L (ref 3.5–5.1)
Sodium: 140 mEq/L (ref 135–145)

## 2017-12-03 LAB — PSA: PSA: 0.57 ng/mL (ref 0.10–4.00)

## 2017-12-03 LAB — LIPID PANEL
CHOL/HDL RATIO: 3
CHOLESTEROL: 200 mg/dL (ref 0–200)
HDL: 59.8 mg/dL (ref >39.00)
LDL CALC: 127 mg/dL — AB (ref 0–99)
NonHDL: 139.99
Triglycerides: 66 mg/dL (ref 0.0–149.0)
VLDL: 13.2 mg/dL (ref 0.0–40.0)

## 2017-12-03 LAB — TSH: TSH: 1.97 u[IU]/mL (ref 0.35–4.50)

## 2017-12-03 MED ORDER — GABAPENTIN 100 MG PO CAPS: 200.0000 mg | ORAL_CAPSULE | Freq: Every day | ORAL | 1 refills | Status: DC

## 2017-12-03 MED ORDER — VALACYCLOVIR HCL 1 G PO TABS: 1000.0000 mg | ORAL_TABLET | Freq: Two times a day (BID) | ORAL | 3 refills | Status: DC

## 2017-12-03 NOTE — Patient Instructions (Signed)

## 2017-12-03 NOTE — Progress Notes (Signed)
Subjective:    Patient ID: Andre Morgan, male    DOB: 1971-06-10, 47 y.o.   MRN: [id number]  HPI  Here for wellness and f/u;  Overall doing ok;  Pt denies Chest pain, worsening SOB, DOE, wheezing, orthopnea, PND, worsening LE edema, palpitations, dizziness or syncope.  Pt denies neurological change such as new headache, facial or extremity weakness.  Pt denies polydipsia, polyuria, or low sugar symptoms. Pt states overall good compliance with treatment and medications, good tolerability, and has been trying to follow appropriate diet.  Pt denies worsening depressive symptoms, suicidal ideation or panic. No fever, night sweats, wt loss, loss of appetite, or other constitutional symptoms.  Pt states good ability with ADL's, has low fall risk, home safety reviewed and adequate, no other significant changes in hearing or vision, and only occasionally active with exercise. Declines flu shot.  Does c/o ongoing fatigue, but denies signficant daytime hypersomnolence.  No other interval hx or new complaints Past Medical History:  Diagnosis Date  . Alcohol abuse 12/05/2011  . Bipolar disorder (HCC) 12/05/2011  . Depression   . Erectile dysfunction 12/09/2011  . 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  reports that he has quit smoking. he has never used smokeless tobacco. He reports that he does not drink alcohol or use drugs. family history is not on file. No Known Allergies Current Outpatient Medications on File Prior to Visit  Medication Sig Dispense Refill  . cyclobenzaprine (FLEXERIL) 5 MG tablet Take 1 tablet (5 mg total) by mouth 3 (three) times daily as needed for muscle spasms. 40 tablet 1   No current facility-administered medications on file prior to visit.    Review of Systems Constitutional: Negative for other unusual diaphoresis, sweats, appetite or weight changes HENT: Negative for other worsening hearing loss, ear pain, facial  swelling, mouth sores or neck stiffness.   Eyes: Negative for other worsening pain, redness or other visual disturbance.  Respiratory: Negative for other stridor or swelling Cardiovascular: Negative for other palpitations or other chest pain  Gastrointestinal: Negative for worsening diarrhea or loose stools, blood in stool, distention or other pain Genitourinary: Negative for hematuria, flank pain or other change in urine volume.  Musculoskeletal: Negative for myalgias or other joint swelling.  Skin: Negative for other color change, or other wound or worsening drainage.  Neurological: Negative for other syncope or numbness. Hematological: Negative for other adenopathy or swelling Psychiatric/Behavioral: Negative for hallucinations, other worsening agitation, SI, self-injury, or new decreased concentration \\All  other system neg per pt    Objective:   Physical Exam BP 120/74   Pulse 83   Temp 98 F (36.7 C) (Oral)   Ht 5\' 11"  (1.803 m)   Wt 221 lb (100.2 kg)   SpO2 98%   BMI 30.82 kg/m  VS noted,  Constitutional: Pt is oriented to person, place, and time. Appears well-developed and well-nourished, in no significant distress and comfortable Head: Normocephalic and atraumatic  Eyes: Conjunctivae and EOM are normal. Pupils are equal, round, and reactive to light Right Ear: External ear normal without discharge Left Ear: External ear normal without discharge Nose: Nose without discharge or deformity Mouth/Throat: Oropharynx is without other ulcerations and moist  Neck: Normal range of motion. Neck supple. No JVD present. No tracheal deviation present or significant neck LA or mass Cardiovascular: Normal rate, regular rhythm, normal heart sounds and intact distal pulses.   Pulmonary/Chest: WOB normal and  breath sounds without rales or wheezing  Abdominal: Soft. Bowel sounds are normal. NT. No HSM  Musculoskeletal: Normal range of motion. Exhibits no edema Lymphadenopathy: Has no other  cervical adenopathy.  Neurological: Pt is alert and oriented to person, place, and time. Pt has normal reflexes. No cranial nerve deficit. Motor grossly intact, Gait intact Skin: Skin is warm and dry. No rash noted or new ulcerations Psychiatric:  Has normal mood and affect. Behavior is normal without agitation No other exam findings    Assessment & Plan:

## 2017-12-04 ENCOUNTER — Other Ambulatory Visit: Payer: Self-pay | Admitting: Internal Medicine

## 2017-12-04 ENCOUNTER — Encounter: Payer: Self-pay | Admitting: Internal Medicine

## 2017-12-04 DIAGNOSIS — R5383 Other fatigue: Secondary | ICD-10-CM | POA: Insufficient documentation

## 2017-12-04 LAB — HIV ANTIBODY (ROUTINE TESTING W REFLEX): HIV 1&2 Ab, 4th Generation: NONREACTIVE

## 2017-12-04 MED ORDER — SILDENAFIL CITRATE 100 MG PO TABS: 50.0000 mg | ORAL_TABLET | Freq: Every day | ORAL | 11 refills | Status: DC | PRN

## 2017-12-04 NOTE — Assessment & Plan Note (Signed)
Ok for viagra prn,  to f/u any worsening symptoms or concerns  

## 2017-12-04 NOTE — Assessment & Plan Note (Signed)
Ok for Vit D and b12, and testosterone levels,  Etiology unclear, Exam otherwise benign, to check labs as documented, follow with expectant management

## 2017-12-04 NOTE — Assessment & Plan Note (Signed)

## 2017-12-24 DIAGNOSIS — S46219A Strain of muscle, fascia and tendon of other parts of biceps, unspecified arm, initial encounter: Secondary | ICD-10-CM | POA: Insufficient documentation

## 2018-01-24 DIAGNOSIS — Z4789 Encounter for other orthopedic aftercare: Secondary | ICD-10-CM | POA: Insufficient documentation

## 2018-01-25 DIAGNOSIS — S46219A Strain of muscle, fascia and tendon of other parts of biceps, unspecified arm, initial encounter: Secondary | ICD-10-CM | POA: Insufficient documentation

## 2018-04-28 ENCOUNTER — Other Ambulatory Visit: Payer: Self-pay | Admitting: Internal Medicine

## 2018-04-28 NOTE — Telephone Encounter (Signed)
Copied from CRM [id number]. Topic: Quick Communication - Rx Refill/Question >> Apr 28, 2018 11:23 AM Alexander BergeronBarksdale, Harvey B wrote: Medication: valACYclovir (VALTREX) 1000 MG tablet [[id number]][[id number]]    Has the patient contacted their pharmacy? Yes.   (Agent: If no, request that the patient contact the pharmacy for the refill.) (Agent: If yes, when and what did the pharmacy advise?)  [facility]erred [facility] (with phone number [facility]  Agent: Please be advised that RX refills may take up to 3 business days. We ask that you follow-up with your pharmacy.

## 2018-04-29 MED ORDER — VALACYCLOVIR HCL 1 G PO TABS
1000.0000 mg | ORAL_TABLET | Freq: Two times a day (BID) | ORAL | 3 refills | Status: DC
Start: 2018-04-29 — End: 2023-12-23

## 2018-04-29 NOTE — Telephone Encounter (Signed)
Refill of Valtrex  LOV  12/03/17  [facility]   [facility]    [facility]  [id number]  [place]

## 2018-04-29 NOTE — Telephone Encounter (Signed)
Patient checking status, requesting to have filled due to him going out of town, aware of office policy, 3 business days

## 2018-07-14 ENCOUNTER — Telehealth: Payer: Self-pay | Admitting: Internal Medicine

## 2018-07-14 MED ORDER — SILDENAFIL CITRATE 100 MG PO TABS: 50.0000 mg | ORAL_TABLET | Freq: Every day | ORAL | 11 refills | Status: DC | PRN

## 2018-07-14 NOTE — Telephone Encounter (Signed)
Copied from CRM [id number]. Topic: Quick Communication - Rx Refill/Question >> Jul 14, 2018  1:15 PM Baldo DaubAlexander, Amber L wrote: Medication: sildenafil (VIAGRA) 100 MG tablet  Has the patient contacted their pharmacy? No - new pharmacy (Agent: If no, request that the patient contact the pharmacy for the refill.) (Agent: If yes, when and what did the pharmacy advise?)  Preferred Pharm[facility] (with phone number or street name[facility] [address] - [place], [place] - [id number] [address] [facility] [address] [phone] (Phone) [phone] (Fax)  Agent: Please be advised that RX refills may take up to 3 business days. We ask that you follow-up with your pharmacy.

## 2019-03-20 ENCOUNTER — Ambulatory Visit (INDEPENDENT_AMBULATORY_CARE_PROVIDER_SITE_OTHER): Payer: Self-pay | Admitting: Internal Medicine

## 2019-03-20 ENCOUNTER — Other Ambulatory Visit (INDEPENDENT_AMBULATORY_CARE_PROVIDER_SITE_OTHER): Payer: Self-pay

## 2019-03-20 ENCOUNTER — Encounter: Payer: Self-pay | Admitting: Internal Medicine

## 2019-03-20 ENCOUNTER — Other Ambulatory Visit: Payer: Self-pay

## 2019-03-20 VITALS — BP 118/68 | HR 83 | Temp 98.3°F | Ht 71.0 in | Wt 194.0 lb

## 2019-03-20 DIAGNOSIS — R7309 Other abnormal glucose: Secondary | ICD-10-CM

## 2019-03-20 DIAGNOSIS — R1032 Left lower quadrant pain: Secondary | ICD-10-CM

## 2019-03-20 DIAGNOSIS — R109 Unspecified abdominal pain: Secondary | ICD-10-CM | POA: Insufficient documentation

## 2019-03-20 DIAGNOSIS — N453 Epididymo-orchitis: Secondary | ICD-10-CM

## 2019-03-20 LAB — BASIC METABOLIC PANEL
BUN: 11 mg/dL (ref 6–23)
CO2: 29 mEq/L (ref 19–32)
Calcium: 9.2 mg/dL (ref 8.4–10.5)
Chloride: 103 mEq/L (ref 96–112)
Creatinine, Ser: 1.03 mg/dL (ref 0.40–1.50)
GFR: 93.96 mL/min (ref >60.00)
Glucose, Bld: 90 mg/dL (ref 70–99)
Potassium: 3.8 mEq/L (ref 3.5–5.1)
Sodium: 140 mEq/L (ref 135–145)

## 2019-03-20 LAB — CBC WITH DIFFERENTIAL/PLATELET
Basophils Absolute: 0 10*3/uL (ref 0.0–0.1)
Basophils Relative: 0.3 % (ref 0.0–3.0)
Eosinophils Absolute: 0.1 10*3/uL (ref 0.0–0.7)
Eosinophils Relative: 2.2 % (ref 0.0–5.0)
HCT: 37.5 % — ABNORMAL LOW (ref 39.0–52.0)
Hemoglobin: 12 g/dL — ABNORMAL LOW (ref 13.0–17.0)
Lymphocytes Relative: 32.4 % (ref 12.0–46.0)
Lymphs Abs: 1.2 10*3/uL (ref 0.7–4.0)
MCHC: 31.9 g/dL (ref 30.0–36.0)
MCV: 71.6 fl — ABNORMAL LOW (ref 78.0–100.0)
Monocytes Absolute: 0.3 10*3/uL (ref 0.1–1.0)
Monocytes Relative: 8.6 % (ref 3.0–12.0)
Neutro Abs: 2.1 10*3/uL (ref 1.4–7.7)
Neutrophils Relative %: 56.5 % (ref 43.0–77.0)
Platelets: 186 10*3/uL (ref 150.0–400.0)
RBC: 5.24 Mil/uL (ref 4.22–5.81)
RDW: 15.1 % (ref 11.5–15.5)
WBC: 3.7 10*3/uL — ABNORMAL LOW (ref 4.0–10.5)

## 2019-03-20 LAB — URINALYSIS, ROUTINE W REFLEX MICROSCOPIC
Bilirubin Urine: NEGATIVE
Hgb urine dipstick: NEGATIVE
Ketones, ur: NEGATIVE
Leukocytes,Ua: NEGATIVE
Nitrite: NEGATIVE
RBC / HPF: NONE SEEN (ref 0–?)
Specific Gravity, Urine: 1.015 (ref 1.000–1.030)
Total Protein, Urine: NEGATIVE
Urine Glucose: NEGATIVE
Urobilinogen, UA: 0.2 (ref 0.0–1.0)
pH: 7 (ref 5.0–8.0)

## 2019-03-20 LAB — HEMOGLOBIN A1C: Hgb A1c MFr Bld: 5.9 % (ref 4.6–6.5)

## 2019-03-20 LAB — HEPATIC FUNCTION PANEL
ALT: 23 U/L (ref 0–53)
AST: 27 U/L (ref 0–37)
Albumin: 4.3 g/dL (ref 3.5–5.2)
Alkaline Phosphatase: 45 U/L (ref 39–117)
Bilirubin, Direct: 0.1 mg/dL (ref 0.0–0.3)
Total Bilirubin: 0.8 mg/dL (ref 0.2–1.2)
Total Protein: 7.3 g/dL (ref 6.0–8.3)

## 2019-03-20 LAB — LIPID PANEL
Cholesterol: 187 mg/dL (ref 0–200)
HDL: 66.7 mg/dL (ref >39.00)
LDL Cholesterol: 106 mg/dL — ABNORMAL HIGH (ref 0–99)
NonHDL: 120.58
Total CHOL/HDL Ratio: 3
Triglycerides: 73 mg/dL (ref 0.0–149.0)
VLDL: 14.6 mg/dL (ref 0.0–40.0)

## 2019-03-20 LAB — TSH: TSH: 3.21 u[IU]/mL (ref 0.35–4.50)

## 2019-03-20 MED ORDER — CIPROFLOXACIN HCL 500 MG PO TABS: 500.0000 mg | ORAL_TABLET | Freq: Two times a day (BID) | ORAL | 0 refills | Status: AC

## 2019-03-20 NOTE — Assessment & Plan Note (Signed)
stable overall by history and exam, recent data reviewed with pt, and pt to continue medical treatment as before,  to f/u any worsening symptoms or concerns, for a1c with labs 

## 2019-03-20 NOTE — Patient Instructions (Signed)
Please take all new medication as prescribed - the antibiotic  Please continue all other medications as before, and refills have been done if requested.  Please have the pharmacy call with any other refills you may need.  Please continue your efforts at being more active, low cholesterol diet, and weight control.  Please keep your appointments with your specialists as you may have planned  Please go to the LAB in the Basement (turn left off the elevator) for the tests to be done today  You will be contacted by phone if any changes need to be made immediately.  Otherwise, you will receive a letter about your results with an explanation, but please check with MyChart first.  Please remember to sign up for MyChart if you have not done so, as this will be important to you in the future with finding out test results, communicating by private email, and scheduling acute appointments online when needed.   

## 2019-03-20 NOTE — Assessment & Plan Note (Signed)
Also for urine cx, r/o UTI given the LLQ pain; also consider renal stone for hematuria

## 2019-03-20 NOTE — Progress Notes (Signed)
Subjective:    Patient ID: Andre MeliaDavid B Esbenshade, male    DOB: 03/15/1971, 48 y.o.   MRN: [id number]  HPI  Here with acute onset left lower abd pain/groin and left testicle area pain and swelling for 3 days, assoc with some mild nausea yesterday but Denies urinary symptoms such as dysuria, frequency, urgency, flank pain, hematuria or vomiting, fever, chills.  Pt denies chest pain, increased sob or doe, wheezing, orthopnea, PND, increased LE swelling, palpitations, dizziness or syncope.   Pt denies polydipsia, polyuria.  Asks for labs today.  A friend had recent kidney stone Past Medical History:  Diagnosis Date  . Alcohol abuse 12/05/2011  . Bipolar disorder (HCC) 12/05/2011  . Depression   . Erectile dysfunction 12/09/2011  . 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 No pertinent surgical history.  reports that he has quit smoking. He has never used smokeless tobacco. He reports that he does not drink alcohol or use drugs. family history is not on file. No Known Allergies Current Outpatient Medications on File Prior to Visit  Medication Sig Dispense Refill  . cyclobenzaprine (FLEXERIL) 5 MG tablet Take 1 tablet (5 mg total) by mouth 3 (three) times daily as needed for muscle spasms. 40 tablet 1  . gabapentin (NEURONTIN) 100 MG capsule Take 2 capsules (200 mg total) by mouth at bedtime. 180 capsule 1  . sildenafil (VIAGRA) 100 MG tablet Take 0.5-1 tablets (50-100 mg total) by mouth daily as needed for erectile dysfunction. 5 tablet 11  . valACYclovir (VALTREX) 1000 MG tablet Take 1 tablet (1,000 mg total) by mouth 2 (two) times daily. 14 tablet 3   No current facility-administered medications on file prior to visit.    Review of Systems  Constitutional: Negative for other unusual diaphoresis or sweats HENT: Negative for ear discharge or swelling Eyes: Negative for other worsening visual disturbances Respiratory: Negative for stridor or other swelling   Gastrointestinal: Negative for worsening distension or other blood Genitourinary: Negative for retention or other urinary change Musculoskeletal: Negative for other MSK pain or swelling Skin: Negative for color change or other new lesions Neurological: Negative for worsening tremors and other numbness  Psychiatric/Behavioral: Negative for worsening agitation or other fatigue All other system neg per pt    Objective:   Physical Exam BP 118/68 (BP Location: Left Arm, Patient Position: Sitting, Cuff Size: Normal)   Pulse 83   Temp 98.3 F (36.8 C) (Oral)   Ht 5\' 11"  (1.803 m)   Wt 194 lb (88 kg)   SpO2 98%   BMI 27.06 kg/m  VS noted,  Constitutional: Pt appears in NAD HENT: Head: NCAT.  Right Ear: External ear normal.  Left Ear: External ear normal.  Eyes: . Pupils are equal, round, and reactive to light. Conjunctivae and EOM are normal Nose: without d/c or deformity Neck: Neck supple. Gross normal ROM Cardiovascular: Normal rate and regular rhythm.   Pulmonary/Chest: Effort normal and breath sounds without rales or wheezing.  Abd:  Soft, NT, ND, + BS, no organomegaly GU: mild swelling and tender epididymus and left testicle o/w benign exam Neurological: Pt is alert. At baseline orientation, motor grossly intact Skin: Skin is warm. No rashes, other new lesions, no LE edema Psychiatric: Pt behavior is normal without agitation  No other exam findings Lab Results  Component Value Date   WBC 5.2 12/03/2017   HGB 12.2 (L) 12/03/2017   HCT 38.2 (L) 12/03/2017   PLT 192.0  12/03/2017   GLUCOSE 92 12/03/2017   CHOL 200 12/03/2017   TRIG 66.0 12/03/2017   HDL 59.80 12/03/2017   LDLCALC 127 (H) 12/03/2017   ALT 31 12/03/2017   AST 34 12/03/2017   NA 140 12/03/2017   K 4.1 12/03/2017   CL 106 12/03/2017   CREATININE 1.28 12/03/2017   BUN 15 12/03/2017   CO2 29 12/03/2017   TSH 1.97 12/03/2017   PSA 0.57 12/03/2017   HGBA1C 6.1 03/10/2016        Assessment & Plan:

## 2019-03-20 NOTE — Assessment & Plan Note (Signed)
Mild to mod, for antibx course,  to f/u any worsening symptoms or concerns 

## 2019-03-22 LAB — URINE CULTURE
MICRO NUMBER:: 483123
Result:: NO GROWTH
SPECIMEN QUALITY:: ADEQUATE

## 2019-04-25 ENCOUNTER — Encounter (HOSPITAL_COMMUNITY): Payer: Self-pay

## 2019-04-25 ENCOUNTER — Emergency Department (HOSPITAL_COMMUNITY)
Admission: EM | Admit: 2019-04-25 | Discharge: 2019-04-25 | Disposition: A | Discharge status: Home / Self Care | Payer: No Typology Code available for payment source | Attending: Emergency Medicine | Admitting: Emergency Medicine

## 2019-04-25 DIAGNOSIS — Y9389 Activity, other specified: Secondary | ICD-10-CM | POA: Diagnosis not present

## 2019-04-25 DIAGNOSIS — Z87891 Personal history of nicotine dependence: Secondary | ICD-10-CM | POA: Insufficient documentation

## 2019-04-25 DIAGNOSIS — W208XXA Other cause of strike by thrown, projected or falling object, initial encounter: Secondary | ICD-10-CM | POA: Insufficient documentation

## 2019-04-25 DIAGNOSIS — Y929 Unspecified place or not applicable: Secondary | ICD-10-CM | POA: Diagnosis not present

## 2019-04-25 DIAGNOSIS — S99922A Unspecified injury of left foot, initial encounter: Secondary | ICD-10-CM | POA: Diagnosis present

## 2019-04-25 DIAGNOSIS — Z79899 Other long term (current) drug therapy: Secondary | ICD-10-CM | POA: Insufficient documentation

## 2019-04-25 DIAGNOSIS — Y990 Civilian activity done for income or pay: Secondary | ICD-10-CM | POA: Insufficient documentation

## 2019-04-25 DIAGNOSIS — S91112A Laceration without foreign body of left great toe without damage to nail, initial encounter: Secondary | ICD-10-CM

## 2019-04-25 MED ORDER — HYDROCODONE-ACETAMINOPHEN 5-325 MG PO TABS: 1.0000 | ORAL_TABLET | Freq: Four times a day (QID) | ORAL | 0 refills | Status: DC | PRN

## 2019-04-25 MED ORDER — IBUPROFEN 800 MG PO TABS: 800.0000 mg | ORAL_TABLET | Freq: Three times a day (TID) | ORAL | 0 refills | Status: DC

## 2019-04-25 MED ORDER — CEPHALEXIN 500 MG PO CAPS: 500.0000 mg | ORAL_CAPSULE | Freq: Two times a day (BID) | ORAL | 0 refills | Status: DC

## 2019-04-25 MED ORDER — BACITRACIN ZINC 500 UNIT/GM EX OINT
TOPICAL_OINTMENT | Freq: Once | CUTANEOUS | Status: AC
  Administered 2019-04-25: 19:00:00 via TOPICAL
  Filled 2019-04-25: qty 0.9

## 2019-04-25 NOTE — ED Notes (Signed)
Bed: WTR7 Expected date:  Expected time:  Means of arrival:  Comments: 

## 2019-04-25 NOTE — ED Notes (Signed)
Bed: WTR6 Expected date:  Expected time:  Means of arrival:  Comments: 

## 2019-04-25 NOTE — ED Triage Notes (Addendum)
Patient states he got hurt at work around 12:30 pm. Patients states he dropped a forklift blade on left foot.   Patient went to urgent care and had x-rays and was told 3 of his toes were broken. Patient sent to ED from urgent care.   Patient states he was told by urgent care that his big toe nail is coming off and there is possibility of infection.   7/10 throbbing pain   Patient has not had any pain medication Patient received tetanus shot today.  Patient placed in boot by urgent care.  A/ox4 Ambulatory in triage.

## 2019-04-25 NOTE — ED Notes (Signed)
Dressing applied to left foot.

## 2019-04-25 NOTE — Discharge Instructions (Signed)
It was my pleasure taking care of you today!   Please take all of your antibiotics until finished!  This to prevent infection.  Ibuprofen as needed for mild to moderate pain.  Your narcotic pain medication only as needed for severe pain - This can make you very drowsy - please do not drink alcohol, operate heavy machinery or drive on this medication.   Keep wound clean and dry. Wash at least twice daily.   Return to ER for redness around the wound, drainage from wound, fever, new or worsening symptoms, any additional concerns.

## 2019-04-25 NOTE — ED Provider Notes (Signed)
[facility]-EMERGENCY DEPT Provider Note   CSN: [id number] Arrival date & time: 04/25/19  1738    History   Chief Complaint Chief Complaint  Patient presents with  . Foot Pain    HPI Andre Morgan is a 48 y.o. male.     The history is provided by the patient and medical records. No language interpreter was used.  Foot Pain     Andre Morgan is a 48 y.o. male  with a PMH as listed below who presents to the Emergency Department from urgent care for further evaluation of left foot.  Patient states he dropped a forklift blade onto the foot.  He went to urgent care and had x-rays which were done showing that he broke 3 of his toes.  There is a laceration to the left big toe, therefore was sent for further evaluation of this.  He was given a tetanus shot already at urgent care and was placed in a cam walker boot.  He does state that he was given information for orthopedic follow-up.  No pain medication given prior to arrival. No numbness.   Past Medical History:  Diagnosis Date  . Alcohol abuse 12/05/2011  . Bipolar disorder (HCC) 12/05/2011  . Depression   . Erectile dysfunction 12/09/2011  . 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 Epididymo-orchitis 03/20/2019  . Abdominal pain 03/20/2019  . Fatigue 12/04/2017  . Low back pain 06/11/2016  . Left shoulder pain 04/22/2016  . Urinary incontinence 03/10/2016  . Rotator cuff tear, right 02/27/2014  . Abnormal liver function tests 02/16/2014  . Right shoulder pain 02/16/2014  . Lateral epicondylitis of right elbow 02/16/2014  . Schizoaffective disorder, bipolar type (HCC) 11/24/2013  . Irregular heart beat 11/24/2013  . Elevated glucose 08/17/2013  . Ulnar neuritis 08/20/2012  . Erectile dysfunction 12/09/2011  . 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 Preventative health care  12/05/2011  . Alcohol abuse 12/05/2011    History reviewed. No pertinent surgical history.      Home Medications    Prior to Admission medications   Medication Sig Start Date End Date Taking? Authorizing Provider  cephALEXin (KEFLEX) 500 MG capsule Take 1 capsule (500 mg total) by mouth 2 (two) times daily. 04/25/19   Lindsey Hommel, Chase PicketJaime Pilcher, PA-C  cyclobenzaprine (FLEXERIL) 5 MG tablet Take 1 tablet (5 mg total) by mouth 3 (three) times daily as needed for muscle spasms. 06/11/16   Corwin LevinsJohn, James W, MD  gabapentin (NEURONTIN) 100 MG capsule Take 2 capsules (200 mg total) by mouth at bedtime. 12/03/17   Corwin LevinsJohn, James W, MD  HYDROcodone-acetaminophen (NORCO/VICODIN) 5-325 MG tablet Take 1 tablet by mouth every 6 (six) hours as needed for severe pain. 04/25/19   Terrence Pizana, Chase PicketJaime Pilcher, PA-C  ibuprofen (ADVIL) 800 MG tablet Take 1 tablet (800 mg total) by mouth 3 (three) times daily. 04/25/19   Roena Sassaman, Chase PicketJaime Pilcher, PA-C  sildenafil (VIAGRA) 100 MG tablet Take 0.5-1 tablets (50-100 mg total) by mouth daily as needed for erectile dysfunction. 07/14/18   Corwin LevinsJohn, James W, MD  valACYclovir (VALTREX) 1000 MG tablet Take 1 tablet (1,000 mg total) by mouth 2 (two) times daily. 04/29/18   Corwin LevinsJohn, James W, MD    Family History No family history on file.  Social History Social History   Tobacco Use  . Smoking status: Former Games developermoker  .  Smokeless tobacco: Never Used  Substance Use Topics  . Alcohol use: No  . Drug use: No     Allergies   Patient has no known allergies.   Review of Systems Review of Systems  Musculoskeletal: Positive for arthralgias.  Skin: Positive for wound.  Neurological: Negative for weakness and numbness.     Physical Exam Updated Vital Signs BP (!) 144/86 (BP Location: Left Arm)   Pulse 60   Temp 98.3 F (36.8 C) (Oral)   Resp 17   SpO2 100%   Physical Exam Vitals signs and nursing note reviewed.  Constitutional:      General: He is not in acute distress.    Appearance: He  is well-developed.  HENT:     Head: Normocephalic and atraumatic.  Neck:     Musculoskeletal: Neck supple.  Cardiovascular:     Rate and Rhythm: Normal rate and regular rhythm.     Heart sounds: Normal heart sounds. No murmur.  Pulmonary:     Effort: Pulmonary effort is normal. No respiratory distress.     Breath sounds: Normal breath sounds.  Musculoskeletal:     Comments: Left foot: 2+ DP. Good cap refill of all digits. Ecchymosis and tenderness to great toe and 2nd, 3rd toes. Great toe has laceration just under the nail. Nail itself is intact.  Skin:    General: Skin is warm and dry.  Neurological:     Mental Status: He is alert and oriented to person, place, and time.      ED Treatments / Results  Labs (all labs ordered are listed, but only abnormal results are displayed) Labs Reviewed - No data to display  EKG None  Radiology No results found.  Procedures Procedures (including critical care time)  Medications Ordered in ED Medications  bacitracin ointment ( Topical Given 04/25/19 1919)     Initial Impression / Assessment and Plan / ED Course  I have reviewed the triage vital signs and the nursing notes.  Pertinent labs & imaging results that were available during my care of the patient were reviewed by me and considered in my medical decision making (see chart for details).       Andre Morgan is a 48 y.o. male who presents to ED from urgent care for further evaluation of left foot injury. NVI on exam. He does have tenderness and ecchymosis to 1st-3rd toes. X-rays at urgent care were obtained showing fracture. He was placed in cam walker and referred to ortho. Nothing further that I feel we need to do for this. He does have a laceration just underneath the nail of the great toe. It appears that skin avulsed and there is really nothing to suture. The nail is completely intact. His tetanus was updated already at urgent care. Cleaned and dressing applied. Will  start on ABX prophylaxis. Discussed home wound care. Return precautions including signs of infection discussed. Follow up plan discussed and all questions answered.   Final Clinical Impressions(s) / ED Diagnoses   Final diagnoses:  Injury of toe on left foot, initial encounter  Laceration of left great toe without foreign body present, nail damage status unspecified, initial encounter    ED Discharge Orders         Ordered    ibuprofen (ADVIL) 800 MG tablet  3 times daily     04/25/19 1840    HYDROcodone-acetaminophen (NORCO/VICODIN) 5-325 MG tablet  Every 6 hours PRN     04/25/19 1840    cephALEXin (  KEFLEX) 500 MG capsule  2 times daily     04/25/19 1840           Shaia Porath, Chase PicketJaime Pilcher, PA-C 04/25/19 1920    Alvira MondaySchlossman, Erin, MD 04/27/19 [phone]

## 2019-05-04 ENCOUNTER — Other Ambulatory Visit: Payer: Self-pay | Admitting: Orthopaedic Surgery

## 2019-05-08 NOTE — Pre-Procedure Instructions (Addendum)
Andre Morgan  05/08/2019     Andre Morgan [address] [place] [address] Phone: [phone] Fax: [phone]   Your procedure is scheduled on Thursday, July 9th  Report to [facility] A at 7:30 A.M.  Call this number if you have problems the morning of surgery:  [phone]   Remember:  Do not eat after midnight.  You may drink clear liquids until 6:30 A.M. Clear liquids allowed are:  Water, Juice (non-citric and without pulp), Carbonated beverages, Clear Tea, Black Coffee only, Plain Jell-O only and Gatorade   Please complete your PRE-SURGERY ENSURE that was provided to you by 6:30 A.M. the morning of surgery.  Please, if able, drink it in one setting. DO NOT SIP.  Take these medicines the morning of surgery with A SIP OF WATER   If needed - HYDROcodone-acetaminophen (NORCO/VICODIN), valACYclovir (VALTREX)    7 days prior to surgery STOP taking any Aspirin (unless otherwise instructed by your surgeon), Aleve, Naproxen, Ibuprofen, Motrin, Advil, Goody's, BC's, all herbal medications, fish oil, and all vitamins.  Special instructions:   [facility]- Preparing For Surgery  Before surgery, you can play an important role. Because skin is not sterile, your skin needs to be as free of germs as possible. You can reduce the number of germs on your skin by washing with CHG (chlorahexidine gluconate) Soap before surgery.  CHG is an antiseptic cleaner which kills germs and bonds with the skin to continue killing germs even after washing.    Oral Hygiene is also important to reduce your risk of infection.  Remember - BRUSH YOUR TEETH THE MORNING OF SURGERY WITH YOUR REGULAR TOOTHPASTE  Please do not use if you have an allergy to CHG or antibacterial soaps. If your skin becomes reddened/irritated stop using the CHG.  Do not shave (including legs and underarms) for at least 48 hours prior to first CHG shower. It  is OK to shave your face.  Please follow these instructions carefully.   1. Shower the NIGHT BEFORE SURGERY and the MORNING OF SURGERY with CHG.   2. If you chose to wash your hair, wash your hair first as usual with your normal shampoo.  3. After you shampoo, rinse your hair and body thoroughly to remove the shampoo.  4. Use CHG as you would any other liquid soap. You can apply CHG directly to the skin and wash gently with a scrungie or a clean washcloth.   5. Apply the CHG Soap to your body ONLY FROM THE NECK DOWN.  Do not use on open wounds or open sores. Avoid contact with your eyes, ears, mouth and genitals (private parts). Wash Face and genitals (private parts)  with your normal soap.  6. Wash thoroughly, paying special attention to the area where your surgery will be performed.  7. Thoroughly rinse your body with warm water from the neck down.  8. DO NOT shower/wash with your normal soap after using and rinsing off the CHG Soap.  9. Pat yourself dry with a CLEAN TOWEL.  10. Wear CLEAN PAJAMAS to bed the night before surgery, wear comfortable clothes the morning of surgery  11. Place CLEAN SHEETS on your bed the night of your first shower and DO NOT SLEEP WITH PETS.  Day of Surgery: Do not wear jewelry, make-up or nail polish.  Do not wear lotions, powders, or perfumes/cologne, or deodorant.  Do not shave 48 hours  prior to surgery.  Men may shave neck and face.  Do not bring valuables to the hospital.  [facility] is not responsible for any belongings or valuables.  Please wear clean clothes to the hospital/surgery center.   Remember to brush your teeth WITH YOUR REGULAR TOOTHPASTE.  Contacts, dentures or bridgework may not be worn into surgery.  Leave your suitcase in the car.  After surgery it may be brought to your room.  For patients admitted to the hospital, discharge time will be determined by your treatment team.  Patients discharged the day of surgery will not be  allowed to drive home.    Please read over the following fact sheets that you were given. Pain Booklet, Coughing and Deep Breathing and Surgical Site Infection Prevention

## 2019-05-09 ENCOUNTER — Other Ambulatory Visit: Payer: Self-pay

## 2019-05-09 ENCOUNTER — Encounter (HOSPITAL_COMMUNITY)
Admission: RE | Admit: 2019-05-09 | Discharge: 2019-05-09 | Disposition: A | Discharge status: Home / Self Care | Payer: No Typology Code available for payment source | Source: Ambulatory Visit

## 2019-05-09 ENCOUNTER — Encounter (HOSPITAL_COMMUNITY): Payer: Self-pay

## 2019-05-09 ENCOUNTER — Other Ambulatory Visit (HOSPITAL_COMMUNITY)
Admission: RE | Admit: 2019-05-09 | Discharge: 2019-05-09 | Disposition: A | Discharge status: Home / Self Care | Payer: Self-pay | Source: Ambulatory Visit | Attending: Orthopaedic Surgery | Admitting: Orthopaedic Surgery

## 2019-05-09 DIAGNOSIS — Z1159 Encounter for screening for other viral diseases: Secondary | ICD-10-CM | POA: Diagnosis not present

## 2019-05-09 DIAGNOSIS — Z79899 Other long term (current) drug therapy: Secondary | ICD-10-CM | POA: Diagnosis not present

## 2019-05-09 DIAGNOSIS — S92422B Displaced fracture of distal phalanx of left great toe, initial encounter for open fracture: Secondary | ICD-10-CM | POA: Diagnosis not present

## 2019-05-09 DIAGNOSIS — Y990 Civilian activity done for income or pay: Secondary | ICD-10-CM | POA: Diagnosis not present

## 2019-05-09 DIAGNOSIS — N529 Male erectile dysfunction, unspecified: Secondary | ICD-10-CM | POA: Diagnosis not present

## 2019-05-09 DIAGNOSIS — W230XXA Caught, crushed, jammed, or pinched between moving objects, initial encounter: Secondary | ICD-10-CM | POA: Diagnosis not present

## 2019-05-09 DIAGNOSIS — F209 Schizophrenia, unspecified: Secondary | ICD-10-CM | POA: Diagnosis not present

## 2019-05-09 DIAGNOSIS — F319 Bipolar disorder, unspecified: Secondary | ICD-10-CM | POA: Diagnosis not present

## 2019-05-09 DIAGNOSIS — M1990 Unspecified osteoarthritis, unspecified site: Secondary | ICD-10-CM | POA: Diagnosis not present

## 2019-05-09 DIAGNOSIS — F17290 Nicotine dependence, other tobacco product, uncomplicated: Secondary | ICD-10-CM | POA: Diagnosis not present

## 2019-05-09 LAB — CBC
HCT: 39.9 % (ref 39.0–52.0)
Hemoglobin: 11.9 g/dL — ABNORMAL LOW (ref 13.0–17.0)
MCH: 22.3 pg — ABNORMAL LOW (ref 26.0–34.0)
MCHC: 29.8 g/dL — ABNORMAL LOW (ref 30.0–36.0)
MCV: 74.7 fL — ABNORMAL LOW (ref 80.0–100.0)
Platelets: 207 10*3/uL (ref 150–400)
RBC: 5.34 MIL/uL (ref 4.22–5.81)
RDW: 15 % (ref 11.5–15.5)
WBC: 3.8 10*3/uL — ABNORMAL LOW (ref 4.0–10.5)
nRBC: 0 % (ref 0.0–0.2)

## 2019-05-09 LAB — BASIC METABOLIC PANEL
Anion gap: 9 (ref 5–15)
BUN: 7 mg/dL (ref 6–20)
CO2: 26 mmol/L (ref 22–32)
Calcium: 9.5 mg/dL (ref 8.9–10.3)
Chloride: 105 mmol/L (ref 98–111)
Creatinine, Ser: 1.15 mg/dL (ref 0.61–1.24)
GFR calc Af Amer: >60 mL/min (ref >60)
GFR calc non Af Amer: >60 mL/min (ref >60)
Glucose, Bld: 78 mg/dL (ref 70–99)
Potassium: 3.7 mmol/L (ref 3.5–5.1)
Sodium: 140 mmol/L (ref 135–145)

## 2019-05-09 LAB — SARS CORONAVIRUS 2 (TAT 6-24 HRS): SARS Coronavirus 2: NEGATIVE

## 2019-05-09 NOTE — Progress Notes (Signed)
PCP - Dr. Cathlean Cower Cardiologist - denies  Chest x-ray - denies EKG - 05/09/2019 Stress Test - per patient "about ten years ago for work" ECHO - denies Cardiac Cath - denies  Sleep Study - denies CPAP - N/A  Blood Thinner Instructions: N/A Aspirin Instructions: N/A  Anesthesia review: No  Coronavirus Screening  Have you experienced the following symptoms:  Cough yes/no: No Fever (>100.40F)  yes/no: No Runny nose yes/no: No Sore throat yes/no: No Difficulty breathing/shortness of breath  yes/no: No  Have you or a family member traveled in the last 14 days and where? yes/no: No  If the patient indicates "YES" to the above questions, their PAT will be rescheduled to limit the exposure to others and, the surgeon will be notified. THE PATIENT WILL NEED TO BE ASYMPTOMATIC FOR 14 DAYS.   If the patient is not experiencing any of these symptoms, the PAT nurse will instruct them to NOT bring anyone with them to their appointment since they may have these symptoms or traveled as well.   Please remind your patients and families that hospital visitation restrictions are in effect and the importance of the restrictions.   Patient denies shortness of breath, fever, cough and chest pain at PAT appointment  Patient verbalized understanding of instructions that were given to them at the PAT appointment. Patient was also instructed that they will need to review over the PAT instructions again at home before surgery.

## 2019-05-11 ENCOUNTER — Encounter (HOSPITAL_COMMUNITY): Payer: Self-pay | Admitting: *Deleted

## 2019-05-11 ENCOUNTER — Ambulatory Visit (HOSPITAL_COMMUNITY)
Admission: RE | Admit: 2019-05-11 | Discharge: 2019-05-11 | Disposition: A | Discharge status: Home / Self Care | Payer: No Typology Code available for payment source | Attending: Orthopaedic Surgery | Admitting: Orthopaedic Surgery

## 2019-05-11 ENCOUNTER — Ambulatory Visit (HOSPITAL_COMMUNITY): Payer: No Typology Code available for payment source | Admitting: Anesthesiology

## 2019-05-11 ENCOUNTER — Other Ambulatory Visit: Payer: Self-pay

## 2019-05-11 ENCOUNTER — Encounter (HOSPITAL_COMMUNITY)
Admission: RE | Disposition: A | Discharge status: Home / Self Care | Payer: Self-pay | Source: Home / Self Care | Attending: Orthopaedic Surgery

## 2019-05-11 DIAGNOSIS — Y990 Civilian activity done for income or pay: Secondary | ICD-10-CM | POA: Insufficient documentation

## 2019-05-11 DIAGNOSIS — F319 Bipolar disorder, unspecified: Secondary | ICD-10-CM | POA: Diagnosis not present

## 2019-05-11 DIAGNOSIS — N529 Male erectile dysfunction, unspecified: Secondary | ICD-10-CM | POA: Diagnosis not present

## 2019-05-11 DIAGNOSIS — Z79899 Other long term (current) drug therapy: Secondary | ICD-10-CM | POA: Diagnosis not present

## 2019-05-11 DIAGNOSIS — S92422B Displaced fracture of distal phalanx of left great toe, initial encounter for open fracture: Secondary | ICD-10-CM | POA: Diagnosis not present

## 2019-05-11 DIAGNOSIS — F17290 Nicotine dependence, other tobacco product, uncomplicated: Secondary | ICD-10-CM | POA: Insufficient documentation

## 2019-05-11 DIAGNOSIS — M1990 Unspecified osteoarthritis, unspecified site: Secondary | ICD-10-CM | POA: Insufficient documentation

## 2019-05-11 DIAGNOSIS — W230XXA Caught, crushed, jammed, or pinched between moving objects, initial encounter: Secondary | ICD-10-CM | POA: Insufficient documentation

## 2019-05-11 DIAGNOSIS — Z1159 Encounter for screening for other viral diseases: Secondary | ICD-10-CM | POA: Insufficient documentation

## 2019-05-11 DIAGNOSIS — F209 Schizophrenia, unspecified: Secondary | ICD-10-CM | POA: Insufficient documentation

## 2019-05-11 HISTORY — PX: OPEN REDUCTION INTERNAL FIXATION (ORIF) PROXIMAL PHALANX: SHX6235

## 2019-05-11 SURGERY — OPEN REDUCTION INTERNAL FIXATION (ORIF) PROXIMAL PHALANX
Anesthesia: General | Site: Foot | Laterality: Left

## 2019-05-11 MED ORDER — BUPIVACAINE HCL 0.5 % IJ SOLN
INTRAMUSCULAR | Status: DC | PRN
  Administered 2019-05-11 (×2): 10 mL

## 2019-05-11 MED ORDER — IBUPROFEN 800 MG PO TABS: 800.0000 mg | ORAL_TABLET | Freq: Three times a day (TID) | ORAL | 0 refills | Status: AC | PRN

## 2019-05-11 MED ORDER — DEXAMETHASONE SODIUM PHOSPHATE 10 MG/ML IJ SOLN
INTRAMUSCULAR | Status: AC
  Filled 2019-05-11: qty 1

## 2019-05-11 MED ORDER — MIDAZOLAM HCL 2 MG/2ML IJ SOLN
INTRAMUSCULAR | Status: AC
  Filled 2019-05-11: qty 2

## 2019-05-11 MED ORDER — IBUPROFEN 800 MG PO TABS
800.0000 mg | ORAL_TABLET | Freq: Once | ORAL | Status: AC
  Administered 2019-05-11: 800 mg via ORAL
  Filled 2019-05-11: qty 1

## 2019-05-11 MED ORDER — ENSURE PRE-SURGERY PO LIQD
296.0000 mL | Freq: Once | ORAL | Status: DC
  Filled 2019-05-11: qty 296

## 2019-05-11 MED ORDER — LIDOCAINE 2% (20 MG/ML) 5 ML SYRINGE
INTRAMUSCULAR | Status: DC | PRN
  Administered 2019-05-11: 60 mg via INTRAVENOUS

## 2019-05-11 MED ORDER — PROPOFOL 10 MG/ML IV BOLUS
INTRAVENOUS | Status: DC | PRN
  Administered 2019-05-11: 150 mg via INTRAVENOUS

## 2019-05-11 MED ORDER — ONDANSETRON HCL 4 MG/2ML IJ SOLN
INTRAMUSCULAR | Status: AC
  Filled 2019-05-11: qty 2

## 2019-05-11 MED ORDER — HYDROMORPHONE HCL 1 MG/ML IJ SOLN: 0.2500 mg | INTRAMUSCULAR | Status: DC | PRN

## 2019-05-11 MED ORDER — ACETAMINOPHEN 500 MG PO TABS
1000.0000 mg | ORAL_TABLET | Freq: Once | ORAL | Status: AC
  Administered 2019-05-11: 09:00:00 1000 mg via ORAL
  Filled 2019-05-11: qty 2

## 2019-05-11 MED ORDER — ONDANSETRON HCL 4 MG/2ML IJ SOLN
INTRAMUSCULAR | Status: DC | PRN
  Administered 2019-05-11: 4 mg via INTRAVENOUS

## 2019-05-11 MED ORDER — PROPOFOL 10 MG/ML IV BOLUS
INTRAVENOUS | Status: AC
  Filled 2019-05-11: qty 20

## 2019-05-11 MED ORDER — LACTATED RINGERS IV SOLN
INTRAVENOUS | Status: DC
  Administered 2019-05-11: 07:00:00 via INTRAVENOUS

## 2019-05-11 MED ORDER — CEFAZOLIN SODIUM-DEXTROSE 2-4 GM/100ML-% IV SOLN
2.0000 g | INTRAVENOUS | Status: AC
  Administered 2019-05-11: 2 g via INTRAVENOUS
  Filled 2019-05-11: qty 100

## 2019-05-11 MED ORDER — MIDAZOLAM HCL 2 MG/2ML IJ SOLN
INTRAMUSCULAR | Status: DC | PRN
  Administered 2019-05-11: 2 mg via INTRAVENOUS

## 2019-05-11 MED ORDER — EPHEDRINE SULFATE-NACL 50-0.9 MG/10ML-% IV SOSY
PREFILLED_SYRINGE | INTRAVENOUS | Status: DC | PRN
  Administered 2019-05-11: 10 mg via INTRAVENOUS

## 2019-05-11 MED ORDER — BSS IO SOLN
INTRAOCULAR | Status: AC
  Filled 2019-05-11: qty 15

## 2019-05-11 MED ORDER — FENTANYL CITRATE (PF) 250 MCG/5ML IJ SOLN
INTRAMUSCULAR | Status: AC
  Filled 2019-05-11: qty 5

## 2019-05-11 MED ORDER — HYDROCODONE-ACETAMINOPHEN 5-325 MG PO TABS: 1.0000 | ORAL_TABLET | ORAL | 0 refills | Status: AC | PRN

## 2019-05-11 MED ORDER — DEXAMETHASONE SODIUM PHOSPHATE 10 MG/ML IJ SOLN
INTRAMUSCULAR | Status: DC | PRN
  Administered 2019-05-11: 5 mg via INTRAVENOUS

## 2019-05-11 MED ORDER — FENTANYL CITRATE (PF) 100 MCG/2ML IJ SOLN
INTRAMUSCULAR | Status: DC | PRN
  Administered 2019-05-11 (×2): 50 ug via INTRAVENOUS

## 2019-05-11 MED ORDER — 0.9 % SODIUM CHLORIDE (POUR BTL) OPTIME
TOPICAL | Status: DC | PRN
  Administered 2019-05-11: 1000 mL

## 2019-05-11 MED ORDER — POVIDONE-IODINE 10 % EX SWAB: 2.0000 "application " | Freq: Once | CUTANEOUS | Status: DC

## 2019-05-11 MED ORDER — LIDOCAINE 2% (20 MG/ML) 5 ML SYRINGE
INTRAMUSCULAR | Status: AC
  Filled 2019-05-11: qty 5

## 2019-05-11 SURGICAL SUPPLY — 47 items
BANDAGE ACE 4X5 VEL STRL LF (GAUZE/BANDAGES/DRESSINGS) ×4 IMPLANT
BANDAGE ACE 6X5 VEL STRL LF (GAUZE/BANDAGES/DRESSINGS) IMPLANT
BENZOIN TINCTURE PRP APPL 2/3 (GAUZE/BANDAGES/DRESSINGS) IMPLANT
BNDG COHESIVE 4X5 TAN STRL (GAUZE/BANDAGES/DRESSINGS) IMPLANT
BNDG ESMARK 4X9 LF (GAUZE/BANDAGES/DRESSINGS) ×3 IMPLANT
CHLORAPREP W/TINT 26 (MISCELLANEOUS) ×3 IMPLANT
CLOSURE WOUND 1/2 X4 (GAUZE/BANDAGES/DRESSINGS)
COVER WAND RF STERILE (DRAPES) IMPLANT
CUFF TOURN SGL QUICK 34 (TOURNIQUET CUFF)
CUFF TRNQT CYL 34X4.125X (TOURNIQUET CUFF) IMPLANT
DECANTER SPIKE VIAL GLASS SM (MISCELLANEOUS) IMPLANT
DRAPE C-ARMOR (DRAPES) IMPLANT
DRAPE IMP U-DRAPE 54X76 (DRAPES) ×3 IMPLANT
DRAPE OEC MINIVIEW 54X84 (DRAPES) ×3 IMPLANT
DRAPE U-SHAPE 47X51 STRL (DRAPES) ×3 IMPLANT
DRSG XEROFORM 1X8 (GAUZE/BANDAGES/DRESSINGS) ×2 IMPLANT
ELECT REM PT RETURN 9FT ADLT (ELECTROSURGICAL) ×3
ELECTRODE REM PT RTRN 9FT ADLT (ELECTROSURGICAL) ×1 IMPLANT
GAUZE SPONGE 4X4 12PLY STRL (GAUZE/BANDAGES/DRESSINGS) ×3 IMPLANT
GAUZE SPONGE 4X4 12PLY STRL LF (GAUZE/BANDAGES/DRESSINGS) ×2 IMPLANT
GAUZE XEROFORM 1X8 LF (GAUZE/BANDAGES/DRESSINGS) ×3 IMPLANT
GLOVE BIO SURGEON STRL SZ7.5 (GLOVE) ×3 IMPLANT
GLOVE BIOGEL PI IND STRL 8 (GLOVE) ×1 IMPLANT
GLOVE BIOGEL PI INDICATOR 8 (GLOVE) ×2
GOWN STRL REUS W/ TWL LRG LVL3 (GOWN DISPOSABLE) ×1 IMPLANT
GOWN STRL REUS W/ TWL XL LVL3 (GOWN DISPOSABLE) ×1 IMPLANT
GOWN STRL REUS W/TWL LRG LVL3 (GOWN DISPOSABLE) ×2
GOWN STRL REUS W/TWL XL LVL3 (GOWN DISPOSABLE) ×2
KIT BASIN OR (CUSTOM PROCEDURE TRAY) ×3 IMPLANT
NS IRRIG 1000ML POUR BTL (IV SOLUTION) ×3 IMPLANT
PACK ORTHO EXTREMITY (CUSTOM PROCEDURE TRAY) ×6 IMPLANT
PAD CAST 4YDX4 CTTN HI CHSV (CAST SUPPLIES) ×1 IMPLANT
PADDING CAST COTTON 4X4 STRL (CAST SUPPLIES) ×2
PADDING CAST SYNTHETIC 4 (CAST SUPPLIES) ×2
PADDING CAST SYNTHETIC 4X4 STR (CAST SUPPLIES) ×1 IMPLANT
SPONGE LAP 18X18 RF (DISPOSABLE) IMPLANT
STRIP CLOSURE SKIN 1/2X4 (GAUZE/BANDAGES/DRESSINGS) IMPLANT
SUT ETHILON 3 0 PS 1 (SUTURE) ×3 IMPLANT
SUT FIBERWIRE 2-0 18 17.9 3/8 (SUTURE)
SUT MNCRL AB 3-0 PS2 18 (SUTURE) ×3 IMPLANT
SUT PDS AB 2-0 CT2 27 (SUTURE) ×3 IMPLANT
SUT VIC AB 3-0 FS2 27 (SUTURE) IMPLANT
SUTURE FIBERWR 2-0 18 17.9 3/8 (SUTURE) IMPLANT
TOWEL GREEN STERILE FF (TOWEL DISPOSABLE) ×6 IMPLANT
TUBE CONNECTING 20'X1/4 (TUBING) ×1
TUBE CONNECTING 20X1/4 (TUBING) ×2 IMPLANT
UNDERPAD 30X30 (UNDERPADS AND DIAPERS) ×3 IMPLANT

## 2019-05-11 NOTE — Transfer of Care (Signed)
Immediate Anesthesia Transfer of Care Note  Patient: Andre Morgan  Procedure(s) Performed: OPEN TREATMENT OF LEFT HALLUX DISTAL PHALANX FRACTURE. IRRIGATION AND DEBRIDEMENT WITH NAIL REMOVAL AND REPAIR OF NAIL BED. (Left Foot)  Patient Location: PACU  Anesthesia Type:General  Level of Consciousness: awake, alert  and oriented  Airway & Oxygen Therapy: Patient Spontanous Breathing  Post-op Assessment: Report given to RN and Post -op Vital signs reviewed and stable  Post vital signs: Reviewed and stable  Last Vitals:  Vitals Value Taken Time  BP    Temp    Pulse 80 05/11/19 1003  Resp 20 05/11/19 1003  SpO2 100 % 05/11/19 1003  Vitals shown include unvalidated device data.  Last Pain:  Vitals:   05/11/19 0708  TempSrc:   PainSc: 0-No pain      Patients Stated Pain Goal: 0 (07/62/26 3335)  Complications: No apparent anesthesia complications

## 2019-05-11 NOTE — Anesthesia Postprocedure Evaluation (Signed)
Anesthesia Post Note  Patient: Carmon Sahli  Procedure(s) Performed: OPEN TREATMENT OF LEFT HALLUX DISTAL PHALANX FRACTURE. IRRIGATION AND DEBRIDEMENT WITH NAIL REMOVAL AND REPAIR OF NAIL BED. (Left Foot)     Patient location during evaluation: PACU Anesthesia Type: General Level of consciousness: awake and alert Pain management: pain level controlled Vital Signs Assessment: post-procedure vital signs reviewed and stable Respiratory status: spontaneous breathing, nonlabored ventilation and respiratory function stable Cardiovascular status: blood pressure returned to baseline and stable Postop Assessment: no apparent nausea or vomiting Anesthetic complications: no    Last Vitals:  Vitals:   05/11/19 1025 05/11/19 1035  BP:  118/66  Pulse: (!) 57 61  Resp: 20   Temp: (!) 36.1 C (!) 36.2 C  SpO2: 100% 100%    Last Pain:  Vitals:   05/11/19 1035  TempSrc:   PainSc: 0-No pain                 Veyda Kaufman,W. EDMOND

## 2019-05-11 NOTE — Anesthesia Procedure Notes (Signed)
Procedure Name: LMA Insertion Date/Time: 05/11/2019 9:19 AM Performed by: Trinna Post., CRNA Pre-anesthesia Checklist: Patient identified, Emergency Drugs available, Suction available, Timeout performed and Patient being monitored Patient Re-evaluated:Patient Re-evaluated prior to induction Oxygen Delivery Method: Circle system utilized Preoxygenation: Pre-oxygenation with 100% oxygen Induction Type: IV induction LMA: LMA inserted LMA Size: 4.0 Number of attempts: 1 Placement Confirmation: positive ETCO2 and breath sounds checked- equal and bilateral Tube secured with: Tape Dental Injury: Teeth and Oropharynx as per pre-operative assessment

## 2019-05-11 NOTE — H&P (Signed)
Andre Morgan is an 48 y.o. male.   Chief Complaint: Left open hallux fracture with nail avulsion and nailbed injury HPI: Andre HuaDavid is here today for open treatment of his left distal phalanx fracture with nailbed repair and nail removal.  He sustained a crush injury to his left hallux a few weeks ago while at work.  We been working on [employer] approval for surgery and he is here today for that.  Patient denies any recent fevers or chills.  He has been taking antibiotics since the time of injury due to the open fracture.  Denies any increased pain, drainage or swelling.  Past Medical History:  Diagnosis Date  . Alcohol abuse 12/05/2011  . Bipolar disorder (HCC) 12/05/2011  . Depression   . Erectile dysfunction 12/09/2011  . 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 TENDON REPAIR      History reviewed. No pertinent family history. Social History:  reports that he has been smoking pipe. He has never used smokeless tobacco. He reports that he does not drink alcohol or use drugs.  Allergies: No Known Allergies  Medications Prior to Admission  Medication Sig Dispense Refill  . cephALEXin (KEFLEX) 500 MG capsule Take 1 capsule (500 mg total) by mouth 2 (two) times daily. 14 capsule 0  . HYDROcodone-acetaminophen (NORCO/VICODIN) 5-325 MG tablet Take 1 tablet by mouth every 6 (six) hours as needed for severe pain. 10 tablet 0  . ibuprofen (ADVIL) 800 MG tablet Take 1 tablet (800 mg total) by mouth 3 (three) times daily. (Patient taking differently: Take 800 mg by mouth 3 (three) times daily as needed (pain.). ) 21 tablet 0  . valACYclovir (VALTREX) 1000 MG tablet Take 1 tablet (1,000 mg total) by mouth 2 (two) times daily. (Patient taking differently: Take 1,000 mg by mouth 2 (two) times daily as needed (outbreaks.). ) 14 tablet 3  . sildenafil (VIAGRA) 100 MG tablet Take 0.5-1 tablets (50-100 mg total) by mouth  daily as needed for erectile dysfunction. 5 tablet 11    Results for orders placed or performed during the hospital encounter of 05/09/19 (from the past 48 hour(s))  SARS Coronavirus 2 (Performed in [facility] hospital lab)     Status: None   Collection Time: 05/09/19  3:00 PM  Result Value Ref Range   SARS Coronavirus 2 NEGATIVE NEGATIVE    Comment: (NOTE) SARS-CoV-2 target nucleic acids are NOT DETECTED. The SARS-CoV-2 RNA is generally detectable in upper and lower respiratory specimens during the acute phase of infection. Negative results do not preclude SARS-CoV-2 infection, do not rule out co-infections with other pathogens, and should not be used as the sole basis for treatment or other patient management decisions. Negative results must be combined with clinical observations, patient history, and epidemiological information. The expected result is Negative. Fact Sheet for Patients: HairSlick.nohttps://www.fda.gov/media/138098/download Fact Sheet for Healthcare Providers: quierodirigir.comhttps://www.fda.gov/media/138095/download This test is not yet approved or cleared by the [place] FDA and  has been authorized for detection and/or diagnosis of SARS-CoV-2 by FDA under an Emergency Use Authorization (EUA). This EUA will remain  in effect (meaning this test can be used) for the duration of the COVID-19 declaration under Section 56 4(b)(1) of the Act, 21 U.S.C. section 360bbb-3(b)(1), unless the authorization is terminated or revoked sooner. Performed at [facility], [address] [place], [place] [address]   Basic metabolic panel  Status: None   Collection Time: 05/09/19  3:52 PM  Result Value Ref Range   Sodium 140 135 - 145 mmol/L   Potassium 3.7 3.5 - 5.1 mmol/L   Chloride 105 98 - 111 mmol/L   CO2 26 22 - 32 mmol/L   Glucose, Bld 78 70 - 99 mg/dL   BUN 7 6 - 20 mg/dL   Creatinine, Ser 1.15 0.61 - 1.24 mg/dL   Calcium 9.5 8.9 - 10.3 mg/dL   GFR calc non Af Amer >60 >60  mL/min   GFR calc Af Amer >60 >60 mL/min   Anion gap 9 5 - 15    Comment: Performed at [facility] [address], [place], [place] [address]  CBC     Status: Abnormal   Collection Time: 05/09/19  3:52 PM  Result Value Ref Range   WBC 3.8 (L) 4.0 - 10.5 K/uL   RBC 5.34 4.22 - 5.81 MIL/uL   Hemoglobin 11.9 (L) 13.0 - 17.0 g/dL   HCT 39.9 39.0 - 52.0 %   MCV 74.7 (L) 80.0 - 100.0 fL   MCH 22.3 (L) 26.0 - 34.0 pg   MCHC 29.8 (L) 30.0 - 36.0 g/dL   RDW 15.0 11.5 - 15.5 %   Platelets 207 150 - 400 K/uL   nRBC 0.0 0.0 - 0.2 %    Comment: Performed at [facility], [place] [address] [place], [place] [address]   No results found.  Review of Systems  Constitutional: Negative.   HENT: Negative.   Eyes: Negative.   Respiratory: Negative.   Cardiovascular: Negative.   Gastrointestinal: Negative.   Musculoskeletal:       Left hallux pain  Skin: Negative.   Neurological: Negative.   Psychiatric/Behavioral: Negative.     Blood pressure 103/63, pulse (!) 57, temperature 97.8 F (36.6 C), temperature source Oral, resp. rate 18, height 5\' 11"  (1.803 m), weight 87.5 kg, SpO2 100 %. Physical Exam  Constitutional: He appears well-developed.  HENT:  Head: Normocephalic.  Eyes: Conjunctivae are normal.  Neck: Neck supple.  Cardiovascular: Normal rate.  Respiratory: Effort normal.  GI: Soft.  Musculoskeletal:     Comments: Left foot demonstrates swelling and nail avulsion of the hallux.  There is tenderness to palpation.  No tenderness palpation the hallux MTP joint.  Did not assess motor about the hallux.  No other evidence of injury.  Sensation tachycardia dorsal plantar foot.  Palpable dorsalis pedis pulse.  Neurological: He is alert.  Skin: Skin is warm.  Psychiatric: He has a normal mood and affect.     Assessment/Plan We will proceed with completion of the nail avulsion with open treatment of his distal phalanx fracture with irrigation debridement of site of open  fracture and nailbed repair.  We discussed the risk, benefits and alternatives of surgery which include but are not limited to wound healing complication, infection, continued pain, need for further surgery, demonstrating structures.  We also discussed the possibility of having deformity of the nail as the nail grows out due to the crush injury.  We also discussed the perioperative and anesthetic risk.  Postoperatively he will be flat foot weightbearing in a postoperative shoe.  Erle Crocker, MD 05/11/2019, 8:44 AM

## 2019-05-11 NOTE — Discharge Instructions (Signed)
DR. Lucia Gaskins FOOT & ANKLE SURGERY POST-OP INSTRUCTIONS   Pain Management 1. The numbing medicine and your leg will last around 3 hours, take a dose of your pain medicine as soon as you feel it wearing off to avoid rebound pain. 2. Keep your foot elevated above heart level.  Make sure that your heel hangs free ('floats'). 3. Take all prescribed medication as directed. 4. If taking narcotic pain medication you may want to use an over-the-counter stool softener to avoid constipation. 5. You may take over-the-counter NSAIDs (ibuprofen, naproxen, etc.) as well as over-the-counter acetaminophen as directed on the packaging as a supplement for your pain and may also use it to wean away from the prescription medication.  Activity ? Weightbearing as tolerated flat foot in post op shoe   First Postoperative Visit 1. Your first postop visit will be at least 2 weeks after surgery.  This should be scheduled when you schedule surgery. 2. If you do not have a postoperative visit scheduled please call [phone] to schedule an appointment. 3. At the appointment your incision will be evaluated for suture removal, x-rays will be obtained if necessary.  General Instructions 1. Swelling is very common after foot and ankle surgery.  It often takes 3 months for the foot and ankle to begin to feel comfortable.  Some amount of swelling will persist for 6-12 months. 2. DO NOT change the dressing.  If there is a problem with the dressing (too tight, loose, gets wet, etc.) please contact Dr. Pollie Friar office. 3. DO NOT get the dressing wet.  For showers you can use an over-the-counter cast cover or wrap a washcloth around the top of your dressing and then cover it with a plastic bag and tape it to your leg. 4. DO NOT soak the incision (no tubs, pools, bath, etc.) until you have approval from Dr. Lucia Gaskins.  Contact Dr. Huel Cote office or go to Emergency Room if: 1. Temperature above 101 F. 2. Increasing pain that is  unresponsive to pain medication or elevation 3. Excessive redness or swelling in your foot 4. Dressing problems - excessive bloody drainage, looseness or tightness, or if dressing gets wet 5. Develop pain, swelling, warmth, or discoloration of your calf

## 2019-05-11 NOTE — Op Note (Addendum)
Andre Morgan male 48 y.o. 05/11/2019  PreOperative Diagnosis: Open left hallux distal phalanx fracture Hallux toenail avulsion Hallux nailbed injury/laceration Second toe distal phalanx fracture Third toe distal phalanx fracture  PostOperative Diagnosis: Same  PROCEDURE: Open treatment of left hallux distal phalanx fracture Irrigation debridement site of open fracture including bone, soft tissue Hallux toenail removal Repair of nailbed Closed treatment without manipulation of second toe distal phalanx fracture Closed treatment without manipulation third toe distal phalanx fracture  SURGEON: [name], MD  ASSISTANT: [name], RNFA  ANESTHESIA: General LMA with local digital block  FINDINGS: Open left hallux distal phalanx fracture with partial nail avulsion and nailbed injury with dorsal cortical piece punctured through the nailbed  IMPLANTS: None  INDICATIONS:48 y.o. male had a crush injury to his left hallux while at work nearly 3 weeks ago.  He was seen in my office and diagnosed with an open distal phalanx fracture of the hallux with a large cortical piece punctured through the nailbed with partial nail avulsion.  He was indicated for open treatment and we sought Worker's Comp. approval.  This took a couple of weeks.  He was placed on prophylactic antibiotics given the open fracture.  He did have initial irrigation of the injury in the urgent care.  We discussed the risk, benefits alternatives of surgery which include but are not limited to wound healing complications, infection, continued pain, toenail issues and deformity.  After weighing these risks he opted proceed with surgery.  PROCEDURE: Patient was identified in preoperative holding area.  The left leg was marked myself.  Consent was signed and is on the patient.  Taken the operative suite for supine operative table.  General LMA anesthesia was induced no difficulty.  Preoperative antibiotics were given.   Bump was placed under the left hip and bone foam was used.  All bony prominences were well-padded.  Left lower extremity was prepped and draped in usual sterile fashion.  Surgical timeout was performed.  We began by using a Freer to elevate the intact portion of the nail.  This was elevated and removed without difficulty.  There was scarring adhesions to the nail medially and laterally.  Then a [name] was used to elevate the tissue overlying the germinal matrix.  This was mobilized.  Within the central portion of the nailbed there is a transverse laceration with exposed bony fragment.  This was mobilized distally and proximally.  This was the area of the nailbed laceration along the complete width.  The bony fragment was pushed back into place however x-ray confirmed that the fragment was pressing on the soft tissues from underneath and therefore rondure was used to debride the bone back to allow for the nailbed repair.  The distal phalanx fracture was reduced through direct pressure and a dorsal position.  Then using a curette the site of open fracture was debrided including bone.  Then irrigation was performed throughout the open fracture to allow washing of the bone and soft tissue.  Then using a 4-0 Chromic Gut suture the nailbed was repaired primarily.  There was good soft tissue apposition.  Then the removed nail was trimmed and debrided of the adhered epidermal tissue.  Then using a 4-0 Chromic Gut the nail was replaced under the eponychial fold proximally to allow for grow out of the subsequent nail.  X-rays confirmed adequate bony removal and reduction of the distal phalanx fracture.  At that time fluoroscopy also confirmed acceptable reduction of the second and third toe distal  phalanx fractures.  We decided to treat these in a closed fashion without manipulation.  Soft dressing was placed including Xeroform, 4 x 4's and sterile she cotton with a 4 inch Ace wrap.  All counts are correct in the case.   There were no complications.  Tolerated well.  POST OPERATIVE INSTRUCTIONS: Flatfoot weightbearing in a postoperative shoe which the patient has Keep limb elevated He will finish his course of antibiotics he has 2 days remaining Follow-up in 2 weeks for wound check.  No x-rays needed.  Will not need suture removal given the use of Chromic Gut suture. Call the office with concerns  TOURNIQUET TIME: No tourniquet was used  BLOOD LOSS:  Minimal         DRAINS: none         SPECIMEN: none       COMPLICATIONS:  * No complications entered in OR log *         Disposition: PACU - hemodynamically stable.         Condition: stable

## 2019-05-11 NOTE — Anesthesia Preprocedure Evaluation (Addendum)
Anesthesia Evaluation  Patient identified by MRN, date of birth, ID band Patient awake    Reviewed: Allergy & Precautions, H&P , NPO status , Patient's Chart, lab work & pertinent test results  Airway Mallampati: II  TM Distance: >3 FB Neck ROM: Full    Dental no notable dental hx. (+) Teeth Intact, Dental Advisory Given   Pulmonary Current Smoker,    Pulmonary exam normal breath sounds clear to auscultation       Cardiovascular negative cardio ROS   Rhythm:Regular Rate:Normal     Neuro/Psych Depression Bipolar Disorder Schizophrenia negative neurological ROS     GI/Hepatic negative GI ROS, Neg liver ROS,   Endo/Other  negative endocrine ROS  Renal/GU negative Renal ROS  negative genitourinary   Musculoskeletal  (+) Arthritis , Osteoarthritis,    Abdominal   Peds  Hematology negative hematology ROS (+)   Anesthesia Other Findings   Reproductive/Obstetrics negative OB ROS                            Anesthesia Physical Anesthesia Plan  ASA: II  Anesthesia Plan: General   Post-op Pain Management:    Induction: Intravenous  PONV Risk Score and Plan: 2 and Ondansetron, Dexamethasone and Midazolam  Airway Management Planned: LMA  Additional Equipment:   Intra-op Plan:   Post-operative Plan: Extubation in OR  Informed Consent: I have reviewed the patients History and Physical, chart, labs and discussed the procedure including the risks, benefits and alternatives for the proposed anesthesia with the patient or authorized representative who has indicated his/her understanding and acceptance.     Dental advisory given  Plan Discussed with: CRNA  Anesthesia Plan Comments:         Anesthesia Quick Evaluation

## 2019-05-12 ENCOUNTER — Encounter (HOSPITAL_COMMUNITY): Payer: Self-pay | Admitting: Orthopaedic Surgery

## 2019-07-26 ENCOUNTER — Other Ambulatory Visit: Payer: Self-pay | Admitting: Internal Medicine

## 2019-07-26 MED ORDER — SILDENAFIL CITRATE 100 MG PO TABS: 50.0000 mg | ORAL_TABLET | Freq: Every day | ORAL | 11 refills | Status: DC | PRN

## 2019-07-26 NOTE — Telephone Encounter (Signed)
Medication Refill - Medication: sildenafil (VIAGRA) 100 MG tablet    Preferred Pharmacy (with phone number or street name): [facility] [address], [place] [phone] (Phone) [phone] (Fax)

## 2020-08-02 ENCOUNTER — Other Ambulatory Visit: Payer: Self-pay | Admitting: Internal Medicine

## 2020-08-11 ENCOUNTER — Ambulatory Visit (HOSPITAL_COMMUNITY)
Admission: EM | Admit: 2020-08-11 | Discharge: 2020-08-11 | Disposition: A | Discharge status: Home / Self Care | Payer: HRSA Program | Attending: Family Medicine | Admitting: Family Medicine

## 2020-08-11 DIAGNOSIS — Z20822 Contact with and (suspected) exposure to covid-19: Secondary | ICD-10-CM | POA: Diagnosis not present

## 2020-08-11 LAB — SARS CORONAVIRUS 2 (TAT 6-24 HRS): SARS Coronavirus 2: NEGATIVE

## 2020-08-11 NOTE — ED Triage Notes (Signed)
Patient in requesting covid test  Denies fever, congestion, runny nose, chills, n/v, diarrhea, cough or other URI sxs

## 2020-11-21 ENCOUNTER — Other Ambulatory Visit: Payer: Self-pay

## 2020-11-21 ENCOUNTER — Encounter: Payer: Self-pay | Admitting: Internal Medicine

## 2020-11-21 ENCOUNTER — Other Ambulatory Visit (INDEPENDENT_AMBULATORY_CARE_PROVIDER_SITE_OTHER): Payer: Managed Care, Other (non HMO)

## 2020-11-21 ENCOUNTER — Ambulatory Visit (INDEPENDENT_AMBULATORY_CARE_PROVIDER_SITE_OTHER): Payer: Managed Care, Other (non HMO) | Admitting: Internal Medicine

## 2020-11-21 VITALS — BP 120/72 | HR 61 | Temp 97.8°F | Ht 71.0 in | Wt 207.0 lb

## 2020-11-21 DIAGNOSIS — Z125 Encounter for screening for malignant neoplasm of prostate: Secondary | ICD-10-CM

## 2020-11-21 DIAGNOSIS — Z0000 Encounter for general adult medical examination without abnormal findings: Secondary | ICD-10-CM

## 2020-11-21 LAB — CBC WITH DIFFERENTIAL/PLATELET
Basophils Absolute: 0 10*3/uL (ref 0.0–0.1)
Basophils Relative: 0.5 % (ref 0.0–3.0)
Eosinophils Absolute: 0.2 10*3/uL (ref 0.0–0.7)
Eosinophils Relative: 6.2 % — ABNORMAL HIGH (ref 0.0–5.0)
HCT: 42.1 % (ref 39.0–52.0)
Hemoglobin: 13.2 g/dL (ref 13.0–17.0)
Lymphocytes Relative: 40.1 % (ref 12.0–46.0)
Lymphs Abs: 1.5 10*3/uL (ref 0.7–4.0)
MCHC: 31.4 g/dL (ref 30.0–36.0)
MCV: 71.7 fl — ABNORMAL LOW (ref 78.0–100.0)
Monocytes Absolute: 0.3 10*3/uL (ref 0.1–1.0)
Monocytes Relative: 9.1 % (ref 3.0–12.0)
Neutro Abs: 1.7 10*3/uL (ref 1.4–7.7)
Neutrophils Relative %: 44.1 % (ref 43.0–77.0)
Platelets: 167 10*3/uL (ref 150.0–400.0)
RBC: 5.87 Mil/uL — ABNORMAL HIGH (ref 4.22–5.81)
RDW: 15 % (ref 11.5–15.5)
WBC: 3.8 10*3/uL — ABNORMAL LOW (ref 4.0–10.5)

## 2020-11-21 NOTE — Patient Instructions (Addendum)
To get the covid shots, go to [place], com, or [email or web address], [email or web address], or [email or web address]  Please continue all other medications as before, and refills have been done if requested.  Please have the pharmacy call with any other refills you may need.  Please continue your efforts at being more active, low cholesterol diet, and weight control.  You are otherwise up to date with prevention measures today.  Please keep your appointments with your specialists as you may have planned  You will be contacted regarding the referral for: colonoscopy  Please go to the LAB at the blood drawing area for the tests to be done - at the [facility] lab today  You will be contacted by phone if any changes need to be made immediately.  Otherwise, you will receive a letter about your results with an explanation, but please check with MyChart first.  Please remember to sign up for MyChart if you have not done so, as this will be important to you in the future with finding out test results, communicating by private email, and scheduling acute appointments online when needed.  Please make an Appointment to return for your 1 year visit, or sooner if needed

## 2020-11-21 NOTE — Progress Notes (Signed)
Established Patient Office Visit  Subjective:  Patient ID: Andre Morgan, male    DOB: 1971/04/05  Age: 50 y.o. MRN: [id number]       Chief Complaint:: wellness exam and Annual Exam       HPI:  Andre Morgan is a 50 y.o. male here for wellness exam .  Has no specific complaints.  Pt denies chest pain, increased sob or doe, wheezing, orthopnea, PND, increased LE swelling, palpitations, dizziness or syncope.  Pt denies new neurological symptoms such as new headache, or facial or extremity weakness or numbness   Pt denies polydipsia, polyuria  Wt Readings from Last 3 Encounters:  11/21/20 207 lb (93.9 kg)  05/11/19 192 lb 14.4 oz (87.5 kg)  05/09/19 193 lb (87.5 kg)   BP Readings from Last 3 Encounters:  11/21/20 120/72  08/11/20 (!) 116/57  05/11/19 118/66       Past Medical History:  Diagnosis Date  . Alcohol abuse 12/05/2011  . Bipolar disorder (HCC) 12/05/2011  . Depression   . Erectile dysfunction 12/09/2011  . 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 OPEN REDUCTION INTERNAL FIXATION (ORIF) PROXIMAL PHALANX Left 05/11/2019   Procedure: OPEN TREATMENT OF LEFT HALLUX DISTAL PHALANX FRACTURE. IRRIGATION AND DEBRIDEMENT WITH NAIL REMOVAL AND REPAIR OF NAIL BED.;  Surgeon: Terance Hart, MD;  Location: [facility] OR;  Service: Orthopedics;  Laterality: Left;  . TENDON REPAIR      reports that he has been smoking pipe. He has never used smokeless tobacco. He reports that he does not drink alcohol and does not use drugs. family history is not on file. No Known Allergies Current Outpatient Medications on File Prior to Visit  Medication Sig Dispense Refill  . sildenafil (VIAGRA) 100 MG tablet TAKE ONE-HALF TO ONE TABLET BY MOUTH DAILY AS NEEDED FOR ERECTILE DYSFUNCTION 5 tablet 11  . cephALEXin (KEFLEX) 500 MG capsule Take 1 capsule (500 mg total) by mouth 2 (two) times daily. (Patient not taking:  Reported on 11/21/2020) 14 capsule 0  . valACYclovir (VALTREX) 1000 MG tablet Take 1 tablet (1,000 mg total) by mouth 2 (two) times daily. (Patient not taking: Reported on 11/21/2020) 14 tablet 3   No current facility-administered medications on file prior to visit.        ROS:  All others reviewed and negative.  Objective        PE:  BP 120/72   Pulse 61   Temp 97.8 F (36.6 C) (Oral)   Ht 5\' 11"  (1.803 m)   Wt 207 lb (93.9 kg)   SpO2 98%   BMI 28.87 kg/m                 Constitutional: Pt appears in NAD               HENT: Head: NCAT.                Right Ear: External ear normal.                 Left Ear: External ear normal.                Eyes: . Pupils are equal, round, and reactive to light. Conjunctivae and EOM are normal               Nose: without d/c or deformity  Neck: Neck supple. Gross normal ROM               Cardiovascular: Normal rate and regular rhythm.                 Pulmonary/Chest: Effort normal and breath sounds without rales or wheezing.                Abd:  Soft, NT, ND, + BS, no organomegaly               Neurological: Pt is alert. At baseline orientation, motor grossly intact               Skin: Skin is warm. No rashes, no other new lesions, LE edema                Psychiatric: Pt behavior is normal without agitation   Assessment/Plan:  Andre Morgan is a 50 y.o. Black or African American [2] male with  has a past medical history of Alcohol abuse (12/05/2011), Bipolar disorder (HCC) (12/05/2011), Depression, Erectile dysfunction (12/09/2011), History of positive PPD (12/08/2011), and Recurrent cold sores (12/09/2011).  Assessment Plan  See notes Labs/data reviewed for each problem:  Micro: none  Cardiac tracings I have personally interpreted today:  none  Pertinent Radiological findings (summarize): none    Health Maintenance Due  Topic Date Due  . COLONOSCOPY (Pts 45-26yrs Insurance coverage will need to be confirmed)  Never done     There are no preventive care reminders to display for this patient.  Lab Results  Component Value Date   TSH 2.91 11/21/2020   Lab Results  Component Value Date   WBC 3.8 (L) 11/21/2020   HGB 13.2 11/21/2020   HCT 42.1 11/21/2020   MCV 71.7 (L) 11/21/2020   PLT 167.0 11/21/2020   Lab Results  Component Value Date   NA 142 11/21/2020   K 4.4 11/21/2020   CO2 28 11/21/2020   GLUCOSE 85 11/21/2020   BUN 10 11/21/2020   CREATININE 1.10 11/21/2020   BILITOT 0.5 11/21/2020   ALKPHOS 57 11/21/2020   AST 27 11/21/2020   ALT 16 11/21/2020   PROT 8.0 11/21/2020   ALBUMIN 4.6 11/21/2020   CALCIUM 10.1 11/21/2020   ANIONGAP 9 05/09/2019   GFR 79.69 11/21/2020   Lab Results  Component Value Date   CHOL 225 (H) 11/21/2020   Lab Results  Component Value Date   HDL 71.60 11/21/2020   Lab Results  Component Value Date   LDLCALC 133 (H) 11/21/2020   Lab Results  Component Value Date   TRIG 104.0 11/21/2020   Lab Results  Component Value Date   CHOLHDL 3 11/21/2020   Lab Results  Component Value Date   HGBA1C 5.9 03/20/2019      Assessment & Plan:   Problem List Items Addressed This Visit      High   Preventative health care - Primary    Overall doing well, age appropriate education and counseling updated, referrals for preventative services and immunizations addressed, dietary and smoking counseling addressed, most recent labs reviewed.  I have personally reviewed and have noted:  1) the patient's medical and social history 2) The pt's use of alcohol, tobacco, and illicit drugs 3) The patient's current medications and supplements 4) Functional ability including ADL's, fall risk, home safety risk, hearing and visual impairment 5) Diet and physical activities 6) Evidence for depression or mood disorder 7) The patient's height, weight, and BMI have been recorded  in the chart  I have made referrals, and provided counseling and education based on review of the  above       Relevant Orders   Lipid panel (Completed)   Hepatic function panel (Completed)   CBC with Differential/Platelet (Completed)   TSH (Completed)   Urinalysis, Routine w reflex microscopic   PSA (Completed)   Basic metabolic panel (Completed)   Ambulatory referral to Gastroenterology      No orders of the defined types were placed in this encounter.   Follow-up: Return in about 1 year (around 11/21/2021).   Oliver Barre, MD 11/24/2020 9:40 PM [facility]

## 2020-11-22 LAB — LIPID PANEL
Cholesterol: 225 mg/dL — ABNORMAL HIGH (ref 0–200)
HDL: 71.6 mg/dL (ref >39.00)
LDL Cholesterol: 133 mg/dL — ABNORMAL HIGH (ref 0–99)
NonHDL: 153.44
Total CHOL/HDL Ratio: 3
Triglycerides: 104 mg/dL (ref 0.0–149.0)
VLDL: 20.8 mg/dL (ref 0.0–40.0)

## 2020-11-22 LAB — BASIC METABOLIC PANEL
BUN: 10 mg/dL (ref 6–23)
CO2: 28 mEq/L (ref 19–32)
Calcium: 10.1 mg/dL (ref 8.4–10.5)
Chloride: 102 mEq/L (ref 96–112)
Creatinine, Ser: 1.1 mg/dL (ref 0.40–1.50)
GFR: 79.69 mL/min (ref >60.00)
Glucose, Bld: 85 mg/dL (ref 70–99)
Potassium: 4.4 mEq/L (ref 3.5–5.1)
Sodium: 142 mEq/L (ref 135–145)

## 2020-11-22 LAB — HEPATIC FUNCTION PANEL
ALT: 16 U/L (ref 0–53)
AST: 27 U/L (ref 0–37)
Albumin: 4.6 g/dL (ref 3.5–5.2)
Alkaline Phosphatase: 57 U/L (ref 39–117)
Bilirubin, Direct: 0.1 mg/dL (ref 0.0–0.3)
Total Bilirubin: 0.5 mg/dL (ref 0.2–1.2)
Total Protein: 8 g/dL (ref 6.0–8.3)

## 2020-11-22 LAB — PSA: PSA: 0.54 ng/mL (ref 0.10–4.00)

## 2020-11-22 LAB — TSH: TSH: 2.91 u[IU]/mL (ref 0.35–4.50)

## 2020-11-23 ENCOUNTER — Other Ambulatory Visit: Payer: Self-pay | Admitting: Internal Medicine

## 2020-11-23 ENCOUNTER — Encounter: Payer: Self-pay | Admitting: Internal Medicine

## 2020-11-23 MED ORDER — ATORVASTATIN CALCIUM 20 MG PO TABS: 20.0000 mg | ORAL_TABLET | Freq: Every day | ORAL | 3 refills | Status: DC

## 2020-11-24 ENCOUNTER — Encounter: Payer: Self-pay | Admitting: Internal Medicine

## 2020-11-24 NOTE — Assessment & Plan Note (Signed)

## 2020-11-25 ENCOUNTER — Encounter: Payer: Self-pay | Admitting: Internal Medicine

## 2020-11-25 ENCOUNTER — Telehealth: Payer: Self-pay

## 2020-11-25 ENCOUNTER — Other Ambulatory Visit (INDEPENDENT_AMBULATORY_CARE_PROVIDER_SITE_OTHER): Payer: Managed Care, Other (non HMO)

## 2020-11-25 DIAGNOSIS — Z0000 Encounter for general adult medical examination without abnormal findings: Secondary | ICD-10-CM | POA: Diagnosis not present

## 2020-11-25 LAB — URINALYSIS, ROUTINE W REFLEX MICROSCOPIC
Bilirubin Urine: NEGATIVE
Hgb urine dipstick: NEGATIVE
Ketones, ur: NEGATIVE
Leukocytes,Ua: NEGATIVE
Nitrite: NEGATIVE
RBC / HPF: NONE SEEN (ref 0–?)
Specific Gravity, Urine: <=1.005 — AB (ref 1.000–1.030)
Total Protein, Urine: NEGATIVE
Urine Glucose: NEGATIVE
Urobilinogen, UA: 0.2 (ref 0.0–1.0)
WBC, UA: NONE SEEN (ref 0–?)
pH: 6 (ref 5.0–8.0)

## 2020-11-25 NOTE — Telephone Encounter (Signed)
[employer] forms faxed

## 2020-11-25 NOTE — Addendum Note (Signed)
Addended by: Miguel Aschoff on: 11/25/2020 09:05 AM   Modules accepted: Orders

## 2021-10-04 ENCOUNTER — Other Ambulatory Visit: Payer: Self-pay | Admitting: Internal Medicine

## 2022-04-07 ENCOUNTER — Ambulatory Visit (HOSPITAL_COMMUNITY)
Admission: EM | Admit: 2022-04-07 | Discharge: 2022-04-07 | Disposition: A | Discharge status: Home / Self Care | Payer: No Payment, Other | Attending: Psychiatry | Admitting: Psychiatry

## 2022-04-07 DIAGNOSIS — F1220 Cannabis dependence, uncomplicated: Secondary | ICD-10-CM | POA: Insufficient documentation

## 2022-04-07 DIAGNOSIS — F419 Anxiety disorder, unspecified: Secondary | ICD-10-CM | POA: Insufficient documentation

## 2022-04-07 DIAGNOSIS — F209 Schizophrenia, unspecified: Secondary | ICD-10-CM | POA: Insufficient documentation

## 2022-04-07 DIAGNOSIS — F1090 Alcohol use, unspecified, uncomplicated: Secondary | ICD-10-CM | POA: Insufficient documentation

## 2022-04-07 DIAGNOSIS — F321 Major depressive disorder, single episode, moderate: Secondary | ICD-10-CM | POA: Insufficient documentation

## 2022-04-07 DIAGNOSIS — R630 Anorexia: Secondary | ICD-10-CM | POA: Insufficient documentation

## 2022-04-07 DIAGNOSIS — Z72820 Sleep deprivation: Secondary | ICD-10-CM | POA: Insufficient documentation

## 2022-04-07 NOTE — ED Provider Notes (Signed)
Behavioral Health Urgent Care Medical Screening Exam  Patient Name: Andre Morgan MRN: [id number] Date of Evaluation: 04/07/22 Chief Complaint:   Diagnosis:  Final diagnoses:  Current moderate episode of major depressive disorder, unspecified whether recurrent ([place])    History of Present illness: Andre Morgan is a 51 y.o. male patient presented to [facility] as a walk in alone with increased depression.  Andre Morgan, 51 y.o., male patient seen face to face by this provider, consulted with Dr. Serafina Mitchell; and chart reviewed on 04/07/22.  Per chart review patient has a history of schizoaffective disorder.  Reports he has been admitted to an inpatient psychiatric hospital in the past.  He has also undergone ECT treatment in 2014.  He currently has no outpatient services in place.  He does not take any medications.    Reports he has been on multiple medications in the past, but is unable to remember the names of the medicines.  On evaluation Andre Morgan reports over the past week he has felt as though something is not right.  He identifies multiple stressors such as he is no longer with his wife, he lives alone, and he may or may not be employed.  Reports he has had thoughts about death because he is getting older but he adamantly denies any SI.  He presents to [place] requesting therapy and medication management.  During evaluation Andre Morgan is sitting in no acute distress.  He is alert/oriented x4 and cooperative.  He is well-groomed and makes good eye contact.  He has normal speech and behavior.  He endorses increased depression and anxiety with decreased sleep, decreased appetite, and tearfulness.  Reports he has lots of thoughts that go through his mind and considers them scattered thoughts.  However his thought process is coherent and relevant.  He is logical and able to answer questions appropriately.  He denies paranoia and delusional thought content objectively he  does not appear to be responding to internal/external stimuli.  He denies AVH/HI.  He denies SI without any intent, plan, or access to means.  He contracts for safety.  He endorses marijuana use daily.  He endorses occasional alcohol use but states he can drink up to 750 mL of Jamison whiskey in a week.  He denies all other substance use.  Discussed [facility] behavioral outpatient services on the second floor including open access walk-in hours.  Patient is in agreement.  Also discussed PHP program, patient declined at this time due to his employment.  At this time Andre Morgan is educated and verbalizes understanding of mental health resources and other crisis services in the community.  He is instructed to call 911 and present to the nearest emergency room should he experience any suicidal/homicidal ideation, auditory/visual/hallucinations, or detrimental worsening of his mental health condition.  He was a also advised by Probation officer that he could call the toll-free phone on insurance card to assist with identifying in network counselors and agencies or number on back of Medicaid card t speak with care coordinator    Psychiatric Specialty Exam  Presentation  General Appearance:Appropriate for Environment; Well Groomed; Neat  Eye Contact:Good  Speech:Clear and Coherent; Normal Rate  Speech Volume:Normal  Handedness:Right   Mood and Affect  Mood:Depressed; Anxious Affect:Congruent  Thought Process  Thought Processes:Coherent Descriptions of Associations:Intact  Orientation:Full (Time, Place and Person)  Thought Content:Logical    Hallucinations:None  Ideas of Reference:None  Suicidal Thoughts:No  Homicidal Thoughts:No   Sensorium  Memory:Immediate Good; Recent Good; Remote Good Judgment:Good Insight:Good  Executive Functions  Concentration:Good Attention Span:Good Andre Morgan Language:Good  Psychomotor Activity  Psychomotor  Activity:Normal  Assets  Assets:Communication Skills; Financial Resources/Insurance; Desire for Improvement; Physical Health; Resilience; Housing  Sleep  Sleep:Fair Number of hours: 5  No data recorded  Physical Exam: Physical Exam Vitals and nursing note reviewed.  Constitutional:      General: He is not in acute distress.    Appearance: Normal appearance. He is well-developed.  HENT:     Head: Normocephalic and atraumatic.  Eyes:     General:        Right eye: No discharge.        Left eye: No discharge.     Conjunctiva/sclera: Conjunctivae normal.  Cardiovascular:     Rate and Rhythm: Normal rate.  Pulmonary:     Effort: Pulmonary effort is normal. No respiratory distress.  Musculoskeletal:        General: Normal range of motion.     Cervical back: Normal range of motion.  Skin:    Coloration: Skin is not jaundiced or pale.  Neurological:     Mental Status: He is alert and oriented to person, place, and time.  Psychiatric:        Attention and Perception: Attention and perception normal.        Mood and Affect: Mood is anxious and depressed.        Speech: Speech normal.        Behavior: Behavior is cooperative.        Thought Content: Thought content normal.        Cognition and Memory: Cognition normal.        Judgment: Judgment normal.   Review of Systems  Constitutional: Negative.   HENT: Negative.    Eyes: Negative.   Respiratory: Negative.    Cardiovascular: Negative.   Musculoskeletal: Negative.   Skin: Negative.   Neurological: Negative.   Psychiatric/Behavioral:  Positive for depression. The patient is nervous/anxious.   There were no vitals taken for this visit. There is no height or weight on file to calculate BMI.  Musculoskeletal: Strength & Muscle Tone: within normal limits Gait & Station: normal Patient leans: N/A   [place] [facility] Discharge Disposition for Follow up and Recommendations: Based on my evaluation the patient does not appear to  have an emergency medical condition and can be discharged with resources and follow up care in outpatient services for Medication Management and Individual Therapy  Discharge patient  Provided resources for [facility] behavioral health outpatient services on second floor including open access walk-in hours.  Provided other outpatient resources including: Health and wellness for primary care needs.  No evidence of imminent risk to self or others at present.    Patient does not meet criteria for psychiatric inpatient admission. Discussed crisis plan, support from social network, calling 911, coming to the Emergency Department, and calling Suicide Hotline.  Revonda Humphrey, NP 04/07/2022, 9:59 PM

## 2022-04-07 NOTE — Discharge Instructions (Addendum)
Please contact one of the following facilities to start medication management and therapy services:   [facility] [address] ([address] [place])  [place], [place]  [address] Phone: [phone] Walk-Ins for medication management are available on Monday, Wednesday, Thursday and Friday from 8am-11am.  It is first-come, first -serve; it is best to arrive by 7:00 AM.   [facility]  [address] [place], [place] [address] Phone: [phone]  [facility]  [address]  [place], [place] [address]  [facility]  [address]  [place], [place] [address] Phone: [phone]

## 2022-04-07 NOTE — BH Assessment (Addendum)
Pt feels like he is "spiraling" and reporting depressive symptoms worsening over the past week. Pt reports several issues including"marriage falling apart", living on his own, stress at work, not sleeping well, anxiety and thoughts about death. Pt denies having suicidal thoughts but reports he obsessively think about death. Pt reports he does not want to kill himself and he does not want to die but he feels he is getting older and thinks about death a lot. Pt has been married for 20 years but separated and living on his own for the past 6 months. Pt hopes is to establish a treatment plan and get connected to outpatient services. Pt denies SI, HI, AVH. THC used daily and he can drink up to of [employer] a week.

## 2022-04-08 ENCOUNTER — Ambulatory Visit (INDEPENDENT_AMBULATORY_CARE_PROVIDER_SITE_OTHER): Payer: No Payment, Other | Admitting: Clinical

## 2022-04-08 DIAGNOSIS — F331 Major depressive disorder, recurrent, moderate: Secondary | ICD-10-CM | POA: Diagnosis not present
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
## 2022-05-26 ENCOUNTER — Encounter: Payer: Self-pay | Admitting: Internal Medicine

## 2022-05-26 ENCOUNTER — Ambulatory Visit (INDEPENDENT_AMBULATORY_CARE_PROVIDER_SITE_OTHER): Payer: 59 | Admitting: Internal Medicine

## 2022-05-26 VITALS — BP 118/66 | HR 77 | Temp 98.2°F | Ht 71.0 in | Wt 194.4 lb

## 2022-05-26 DIAGNOSIS — Z0001 Encounter for general adult medical examination with abnormal findings: Secondary | ICD-10-CM | POA: Diagnosis not present

## 2022-05-26 DIAGNOSIS — E559 Vitamin D deficiency, unspecified: Secondary | ICD-10-CM

## 2022-05-26 DIAGNOSIS — E7800 Pure hypercholesterolemia, unspecified: Secondary | ICD-10-CM | POA: Diagnosis not present

## 2022-05-26 DIAGNOSIS — E538 Deficiency of other specified B group vitamins: Secondary | ICD-10-CM

## 2022-05-26 DIAGNOSIS — Z125 Encounter for screening for malignant neoplasm of prostate: Secondary | ICD-10-CM

## 2022-05-26 DIAGNOSIS — R7309 Other abnormal glucose: Secondary | ICD-10-CM

## 2022-05-26 DIAGNOSIS — F1010 Alcohol abuse, uncomplicated: Secondary | ICD-10-CM

## 2022-05-26 DIAGNOSIS — Z87891 Personal history of nicotine dependence: Secondary | ICD-10-CM

## 2022-05-26 DIAGNOSIS — R42 Dizziness and giddiness: Secondary | ICD-10-CM | POA: Diagnosis not present

## 2022-05-26 DIAGNOSIS — Z1211 Encounter for screening for malignant neoplasm of colon: Secondary | ICD-10-CM

## 2022-05-26 DIAGNOSIS — F1291 Cannabis use, unspecified, in remission: Secondary | ICD-10-CM | POA: Insufficient documentation

## 2022-05-26 LAB — BASIC METABOLIC PANEL
BUN: 11 mg/dL (ref 6–23)
CO2: 30 mEq/L (ref 19–32)
Calcium: 9.4 mg/dL (ref 8.4–10.5)
Chloride: 103 mEq/L (ref 96–112)
Creatinine, Ser: 1.13 mg/dL (ref 0.40–1.50)
GFR: 76.35 mL/min (ref >60.00)
Glucose, Bld: 88 mg/dL (ref 70–99)
Potassium: 4 mEq/L (ref 3.5–5.1)
Sodium: 140 mEq/L (ref 135–145)

## 2022-05-26 LAB — LIPID PANEL
Cholesterol: 198 mg/dL (ref 0–200)
HDL: 65.2 mg/dL (ref >39.00)
LDL Cholesterol: 119 mg/dL — ABNORMAL HIGH (ref 0–99)
NonHDL: 132.7
Total CHOL/HDL Ratio: 3
Triglycerides: 71 mg/dL (ref 0.0–149.0)
VLDL: 14.2 mg/dL (ref 0.0–40.0)

## 2022-05-26 LAB — CBC WITH DIFFERENTIAL/PLATELET
Basophils Absolute: 0 10*3/uL (ref 0.0–0.1)
Basophils Relative: 0.3 % (ref 0.0–3.0)
Eosinophils Absolute: 0.1 10*3/uL (ref 0.0–0.7)
Eosinophils Relative: 2.9 % (ref 0.0–5.0)
HCT: 36.6 % — ABNORMAL LOW (ref 39.0–52.0)
Hemoglobin: 11.7 g/dL — ABNORMAL LOW (ref 13.0–17.0)
Lymphocytes Relative: 28.2 % (ref 12.0–46.0)
Lymphs Abs: 1.1 10*3/uL (ref 0.7–4.0)
MCHC: 31.9 g/dL (ref 30.0–36.0)
MCV: 71.7 fl — ABNORMAL LOW (ref 78.0–100.0)
Monocytes Absolute: 0.3 10*3/uL (ref 0.1–1.0)
Monocytes Relative: 7.2 % (ref 3.0–12.0)
Neutro Abs: 2.4 10*3/uL (ref 1.4–7.7)
Neutrophils Relative %: 61.4 % (ref 43.0–77.0)
Platelets: 163 10*3/uL (ref 150.0–400.0)
RBC: 5.1 Mil/uL (ref 4.22–5.81)
RDW: 14.9 % (ref 11.5–15.5)
WBC: 3.9 10*3/uL — ABNORMAL LOW (ref 4.0–10.5)

## 2022-05-26 LAB — HEPATIC FUNCTION PANEL
ALT: 25 U/L (ref 0–53)
AST: 40 U/L — ABNORMAL HIGH (ref 0–37)
Albumin: 4.3 g/dL (ref 3.5–5.2)
Alkaline Phosphatase: 45 U/L (ref 39–117)
Bilirubin, Direct: 0.2 mg/dL (ref 0.0–0.3)
Total Bilirubin: 0.8 mg/dL (ref 0.2–1.2)
Total Protein: 7.3 g/dL (ref 6.0–8.3)

## 2022-05-26 LAB — URINALYSIS, ROUTINE W REFLEX MICROSCOPIC
Bilirubin Urine: NEGATIVE
Hgb urine dipstick: NEGATIVE
Ketones, ur: NEGATIVE
Leukocytes,Ua: NEGATIVE
Nitrite: NEGATIVE
RBC / HPF: NONE SEEN (ref 0–?)
Specific Gravity, Urine: 1.015 (ref 1.000–1.030)
Total Protein, Urine: NEGATIVE
Urine Glucose: NEGATIVE
Urobilinogen, UA: 0.2 (ref 0.0–1.0)
pH: 7 (ref 5.0–8.0)

## 2022-05-26 LAB — HEMOGLOBIN A1C: Hgb A1c MFr Bld: 6 % (ref 4.6–6.5)

## 2022-05-26 LAB — PSA: PSA: 0.55 ng/mL (ref 0.10–4.00)

## 2022-05-26 LAB — VITAMIN B12: Vitamin B-12: 372 pg/mL (ref 211–911)

## 2022-05-26 LAB — VITAMIN D 25 HYDROXY (VIT D DEFICIENCY, FRACTURES): VITD: 74.01 ng/mL (ref 30.00–100.00)

## 2022-05-26 LAB — TSH: TSH: 3.18 u[IU]/mL (ref 0.35–5.50)

## 2022-05-26 NOTE — Patient Instructions (Signed)
You will be contacted regarding the referral for: colonoscopy ° °Please continue all other medications as before, and refills have been done if requested. ° °Please have the pharmacy call with any other refills you may need. ° °Please continue your efforts at being more active, low cholesterol diet, and weight control. ° °You are otherwise up to date with prevention measures today. ° °Please keep your appointments with your specialists as you may have planned ° °Please go to the LAB at the blood drawing area for the tests to be done ° °You will be contacted by phone if any changes need to be made immediately.  Otherwise, you will receive a letter about your results with an explanation, but please check with MyChart first. ° °Please remember to sign up for MyChart if you have not done so, as this will be important to you in the future with finding out test results, communicating by private email, and scheduling acute appointments online when needed. ° °Please make an Appointment to return for your 1 year visit, or sooner if needed °

## 2022-05-28 ENCOUNTER — Encounter: Payer: Self-pay | Admitting: Internal Medicine

## 2022-05-28 DIAGNOSIS — E785 Hyperlipidemia, unspecified: Secondary | ICD-10-CM | POA: Insufficient documentation

## 2022-05-28 NOTE — Assessment & Plan Note (Signed)

## 2022-05-28 NOTE — Assessment & Plan Note (Signed)
Mild, etiology unclear, for lab including cbc

## 2022-05-28 NOTE — Assessment & Plan Note (Signed)
States none for 6 wks, encouraged to abstain

## 2022-05-28 NOTE — Progress Notes (Signed)
Patient ID: Andre Morgan, male   DOB: November 02, 1971, 51 y.o.   MRN: [id number]         Chief Complaint:: wellness exam and intermittent dizziness, substance abuse, hyperglycemia       HPI:  Andre Morgan is a 51 y.o. male here for wellness exam; due for colonoscopy, o/w up to date                        Also Pt denies chest pain, increased sob or doe, wheezing, orthopnea, PND, increased LE swelling, palpitations, dizziness or syncope.   Pt denies polydipsia, polyuria, or new focal neuro s/s.    Pt denies fever, wt loss, night sweats, loss of appetite, or other constitutional symptoms  No smoking for over 1 yr, and no marijauna or ETOH in 6 wks.  Pt denies chest pain, increased sob or doe, wheezing, orthopnea, PND, increased LE swelling, palpitations, or syncope, but has had mild intermittent dizziness without lightheaded or vertigo.     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 IRRIGATION AND DEBRIDEMENT WITH NAIL REMOVAL AND REPAIR OF NAIL BED.;  Surgeon: Terance Hart, MD;  Location: [facility] OR;  Service: Orthopedics;  Laterality: Left;   TENDON REPAIR      reports that he quit  smoking about 2 years ago. His smoking use included pipe. He has never used smokeless tobacco. He reports that he does not currently use drugs after having used the following drugs: Marijuana. He reports that he does not drink alcohol. family history is not on file. No Known Allergies Current Outpatient Medications on File Prior to Visit  Medication Sig Dispense Refill   atorvastatin (LIPITOR) 20 MG tablet Take 1 tablet (20 mg total) by mouth daily. 90 tablet 3   sildenafil (VIAGRA) 100 MG tablet TAKE ONE-HALF TO ONE (0.5-1) TABLET BY MOUTH DAILY AS NEEDED FOR ERECTILE DYSFUNCTION (Patient not taking: Reported on 05/26/2022) 5 tablet 11   valACYclovir (VALTREX) 1000 MG tablet Take 1 tablet (1,000 mg total) by mouth 2 (two) times daily. (Patient not taking: Reported on 11/21/2020) 14 tablet 3   No current facility-administered medications on file prior to visit.        ROS:  All others reviewed and negative.  Objective        PE:  BP 118/66 (BP Location: Left Arm, Patient Position: Sitting, Cuff Size: Large)   Pulse 77   Temp 98.2 F (36.8 C) (Oral)   Ht 5\' 11"  (1.803 m)   Wt 194 lb 6.4 oz (88.2 kg)   SpO2 99%   BMI 27.11 kg/m  Constitutional: Pt appears in NAD               HENT: Head: NCAT.                Right Ear: External ear normal.                 Left Ear: External ear normal.                Eyes: . Pupils are equal, round, and reactive to light. Conjunctivae and EOM are normal               Nose: without d/c or deformity               Neck: Neck supple. Gross normal ROM               Cardiovascular: Normal rate and regular rhythm.                 Pulmonary/Chest: Effort normal and breath sounds without rales or wheezing.                Abd:  Soft, NT, ND, + BS, no organomegaly               Neurological: Pt is alert. At baseline orientation, motor grossly intact               Skin: Skin is warm. No rashes, no other new lesions, LE edema - none                Psychiatric: Pt behavior is normal without agitation   Micro: none  Cardiac tracings I have personally interpreted today:  none  Pertinent Radiological findings (summarize): none   Lab Results  Component Value Date   WBC 3.9 (L) 05/26/2022   HGB 11.7 (L) 05/26/2022   HCT 36.6 (L) 05/26/2022   PLT 163.0 05/26/2022   GLUCOSE 88 05/26/2022   CHOL 198 05/26/2022   TRIG 71.0 05/26/2022   HDL 65.20 05/26/2022   LDLCALC 119 (H) 05/26/2022   ALT 25 05/26/2022   AST 40 (H) 05/26/2022   NA 140 05/26/2022   K 4.0 05/26/2022   CL 103 05/26/2022   CREATININE 1.13 05/26/2022   BUN 11 05/26/2022   CO2 30 05/26/2022   TSH 3.18 05/26/2022   PSA 0.55 05/26/2022   HGBA1C 6.0 05/26/2022   Assessment/Plan:  Andre Morgan is a 51 y.o. Black or African American [2] male with  has a past medical history of Alcohol abuse (12/05/2011), Bipolar disorder (HCC) (12/05/2011), Depression, Erectile dysfunction (12/09/2011), History of positive PPD (12/08/2011), and Recurrent cold sores (12/09/2011).  Encounter for well adult exam with abnormal findings Age and sex appropriate education and counseling updated with regular exercise and diet Referrals for preventative services - for colonoscopy Immunizations addressed - none needed Smoking counseling  - none needed Evidence for depression or other mood disorder - none significant Most recent labs reviewed. I have personally reviewed and have noted: 1) the patient's medical and social history 2) The patient's current medications and supplements 3) The patient's height, weight, and BMI have been recorded in the chart   History of marijuana use States none for 6 wks, encouraged to abstain  Alcohol abuse States none for 6 wks, encouraged to abstain  Dizziness Mild, etiology unclear, for lab including cbc  Elevated glucose Lab Results  Component Value Date   HGBA1C 6.0 05/26/2022   Stable, pt to continue current medical treatment  -  diet, wt control,  excercise   HLD (hyperlipidemia) Lab Results  Component Value Date   LDLCALC 119 (H) 05/26/2022   Mild uncontrolled, pt to restart lipitor 20 mg qd  Followup: Return in about 1 year (around 05/27/2023).  Oliver Barre, MD 05/28/2022 8:55 PM [facility]  Primary Care - [facility] Internal Medicine

## 2022-05-28 NOTE — Assessment & Plan Note (Signed)
Lab Results  Component Value Date   HGBA1C 6.0 05/26/2022   Stable, pt to continue current medical treatment  - diet, wt control, excercise

## 2022-05-28 NOTE — Assessment & Plan Note (Signed)
Lab Results  Component Value Date   LDLCALC 119 (H) 05/26/2022   Mild uncontrolled, pt to restart lipitor 20 mg qd

## 2022-05-28 NOTE — Assessment & Plan Note (Signed)
States none for 6 wks, encouraged to abstain 

## 2022-06-18 ENCOUNTER — Ambulatory Visit (HOSPITAL_COMMUNITY): Payer: 59 | Admitting: Clinical

## 2022-06-25 ENCOUNTER — Ambulatory Visit (INDEPENDENT_AMBULATORY_CARE_PROVIDER_SITE_OTHER): Payer: No Payment, Other | Admitting: Student in an Organized Health Care Education/Training Program

## 2022-06-25 ENCOUNTER — Ambulatory Visit (INDEPENDENT_AMBULATORY_CARE_PROVIDER_SITE_OTHER): Payer: No Payment, Other | Admitting: Clinical

## 2022-06-25 ENCOUNTER — Encounter (HOSPITAL_COMMUNITY): Payer: Self-pay | Admitting: Student in an Organized Health Care Education/Training Program

## 2022-06-25 VITALS — BP 111/69 | HR 52 | Ht 71.0 in | Wt 198.0 lb

## 2022-06-25 DIAGNOSIS — F250 Schizoaffective disorder, bipolar type: Secondary | ICD-10-CM | POA: Diagnosis not present

## 2022-06-25 DIAGNOSIS — F331 Major depressive disorder, recurrent, moderate: Secondary | ICD-10-CM

## 2022-06-25 MED ORDER — ESCITALOPRAM OXALATE 10 MG PO TABS: 10.0000 mg | ORAL_TABLET | Freq: Every day | ORAL | 1 refills | Status: DC

## 2022-06-25 MED ORDER — OLANZAPINE 5 MG PO TABS: 5.0000 mg | ORAL_TABLET | Freq: Every day | ORAL | 1 refills | Status: DC

## 2022-06-25 NOTE — Progress Notes (Signed)
THERAPIST PROGRESS NOTE  Session Time: 35 minutes  Participation Level: Active  Behavioral Response: CasualAlertDepressed  Type of Therapy: Individual Therapy  Treatment Goals addressed: client will identify 3 cognitive patters and beliefs that support depression  ProgressTowards Goals: Not Progressing  Interventions: CBT and Supportive  Summary:  Andre Morgan is a 51 y.o. male who presents for the scheduled appointment oriented x5, appropriately dressed, and friendly.  Client denied hallucinations and delusions. Client reported over the past few weeks things have been feeling chaotic and he has been out of sorts.  Client reported he would not describe his symptoms as depressed but has noted he has a hard time with focusing, his thoughts are not clear, and feeling that he wants to withdraw from everyone. Client reported the symptoms are not related to thoughts of self-harm or suicidal ideations.  Client reported he has the feeling of wanting to be alone which is always been a thing for him.  Client reported for a while he felt like he was doing good but about 2 months ago that changed.  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 Client stated "I thought it was God talking to me but I am wondering if it was just crazy". Client reported he has not walked away in a long time but all he sees is "woods".  Client reported he has 8 kids and his youngest now will be starting high school.  Client reported they are very understanding of his mental health and they enjoy spending time with him. 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 Evidence of progress towards goal:  client reported 1 positive coping skill which is reading his bible/ devotionals. Client reported 1 negative belief about himself that  contributes to depression. Client reported 1 positive which is wanting to be evaluated by a psychiatrist on this date for medication management.    Suicidal/Homicidal: Nowithout intent/plan  Therapist Response:  Therapist began the appointment asking the client how he has been doing since last seen. Therapist used CBT to engage using active listening and positive emotional support. Therapist used CBT to engage and ask the client to elaborate on his depressive symptoms. Therapist used CBT to engage discuss the clients thought processing/ behaviors as it relates to prior history of behaviors and symptoms. Therapist used CBT to engage with the client to identify positive support. Therapist used CBT ask the client to identify his progress with frequency of use with coping skills with continued practice in his daily activity.    Therapist assigned the client homework to follow through on the psych evaluation and medication regimen.   Plan: Return again in 5 weeks.  Diagnosis: Schizoaffective disorder, bipolar type  Collaboration of Care: Other therapist assisted the client with being scheduled for initial psychiatric evaluation on this date.  Patient/Guardian was advised Release of Information must be obtained prior to any record release in order to collaborate their care with an outside provider. Patient/Guardian was advised if they have not already done so to contact the registration department to sign all necessary forms in order for [place] to release information regarding their care.   Consent: Patient/Guardian gives verbal consent for treatment and assignment of benefits for services provided during this visit. Patient/Guardian expressed understanding and agreed to proceed.  Andre Rhymes [name], LCSW 06/25/2022

## 2022-06-25 NOTE — Progress Notes (Addendum)
Psychiatric Initial Adult Assessment   Patient Identification: Andre Morgan MRN:  [id number] Date of Evaluation:  06/25/2022 Referral Source: [facility] Building follow up Chief Complaint:   Chief Complaint  Patient presents with   Medication Management   Establish Care   Visit Diagnosis:    ICD-10-CM   1. Schizoaffective disorder, bipolar type (HCC)  F25.0 escitalopram (LEXAPRO) 10 MG tablet    OLANZapine (ZYPREXA) 5 MG tablet    2. Major depressive disorder, recurrent episode, moderate with anxious distress (HCC)  F33.1 escitalopram (LEXAPRO) 10 MG tablet    OLANZapine (ZYPREXA) 5 MG tablet      History of Present Illness:   Andre Morgan is a 51 yr old male who presents to establish care and for medication management.  PPHx is significant for Schizoaffective Disorder, Bipolar Type, Depression, Anxiety, ECT in 2014, one Suicide Attempt (age 38 cut wrists) and multiple "cry for attention" attempts, and multiple hospitalizations (latest [facility] 2012), and no history of Self Injurious Behavior.   He reports that he has been going through several stressors lately and for a while has not been "living life" just going through the motions.  He reports that he no longer has any drive to do things.  He reports that he has a lot of anxiety and frustration over things.  He reports he realizes he is more irritable lately.  He reports that his mental health issues started when he was around 17 or 18.  He reports that he was adopted at 88 months old but was told he was adopted when he was 8.  He reports that he within the last 2 years got in touch with his biological family mainly through [email or web address].  He reports that he found out he is the youngest of 7 children and his mother is the youngest of 48.  He does report that at one time he was working at [employer] and that him getting injured on the job he saw as penitence for past things he has done.  He reports that approximately 3 months ago he had a manic  episode where he became hyper religious, did not need to sleep, quit his job, spent money, and tried to do several things but did not accomplish any of them.  He states this lasted a few weeks.  He reports that for several years he did have thoughts that a presence was following him and that it wanted him to speak to him but he knew if he did terrible things would happen.  He states that this stopped after he went through ECT.  He reports past psychiatric history significant for schizoaffective disorder bipolar type, depression, anxiety, and bipolar disorder.  He reports that he did undergo ECT in 2014.  He reports that he did have a serious suicide attempt at age 15 when he was at [facility] [employer] [facility] he cut both of his wrists in the dorm but was found.  He reports that he did have several other suicide attempts that he calls cries for attention.  He reports no history of self-injurious behavior.  He reports multiple hospitalizations his latest 1 being at [facility] in 2012.  He reports previously being on Lexapro, Depakote, Seroquel, Haldol, Abilify, and Zyprexa.  He reports one side effect of muscle/jaw twitching in the past but does not remember which medication caused it.  He reports his past medical history significant for elevated cholesterol.  He reports past surgical history significant for left arm tendon repair and  left foot surgery after a crush injury.  He reports no history of head trauma or seizures.  He reports NKDA.  He reports that he currently lives in an apartment.  He reports he is married to his wife of 12 years but that they have been separated for about 1 year.  He has 8 kids and 6 grandkids.  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  He graduated high school and attended 1 year of college.  He reports that he was drinking alcohol about 1 bottle of whiskey around every 4 days but then would not drink again for several weeks.  He reports that 3 months ago he stopped  drinking altogether.  He reports he does occasionally smoke a pipe.  He reports that he had been smoking THC daily but that he quit 3 months ago and reports no other substance use.  He reports no current legal issues.  He reports that he does have a firearm that he has for self defense reports never having any thoughts of hurting himself or anyone else.  Discussed that given the symptoms he described it does seem like he had a manic episode about 3 months ago.  Also discussed his report of thoughts that something was following him that only stopped after ECT does sound like psychotic symptoms.  Discussed with him that given this the diagnosis of schizoaffective disorder did seem appropriate.  Discussed the importance of starting an antipsychotic as this would both help with psychotic symptoms and act as a mood stabilizer and discussed starting Zyprexa for these.  Also discussed starting an antidepressant, Lexapro, to help with his depression and anxiety but that he would need to start the Zyprexa first to ensure mood stability prior to starting the antidepressant.  Discussed potential risks and side effects of these medications and he was agreeable to this.  He reported understanding of the need to start the Zyprexa first prior to starting the Lexapro.  Discussed that he would need routine monitoring of his weight, A1c, and cholesterol while on Zyprexa and that his PCP had already checked these so he would not need this for several months.  Discussed the need to obtain a new EKG at his next PCP visit and he reported understanding.  Discussed the importance of contacting us if he develops any muscle twitching as he had in the past seeing that most likely it was the Haldol that because that which is a first generation antipsychotic but that Zyprexa being a second generation antipsychotic would be less likely to cause this.  He reports his sleep has been fair about 5 hours a night.  He reports his appetite is doing  okay.  He reports no SI, HI, or AVH.  He reports no current manic symptoms.  Discussed with him what to do in the event of a future crisis.  Discussed that [facility], [facility], go to the nearest ED, or call [phone] or [phone].   He reported understanding and had no concerns.    Associated Signs/Symptoms: Depression Symptoms:  depressed mood, anhedonia, fatigue, feelings of worthlessness/guilt, difficulty concentrating, impaired memory, anxiety, loss of energy/fatigue, disturbed sleep, (Hypo) Manic Symptoms:  Elevated Mood, Flight of Ideas, Licensed conveyancerinancial Extravagance, Grandiosity, Impulsivity, Labiality of Mood, Hyper-religious, felt like a presence around him Anxiety Symptoms:  Excessive Worry, Psychotic Symptoms:   Felt presence around him that wanted him to talk to it but was scared of it around 2014 PTSD Symptoms: Negative  Past  Psychiatric History: Schizoaffective Disorder, Bipolar Type, Depression, Anxiety, and Bipolar Disorder, ECT in 2014, one Suicide Attempt (age 38 cut wrists) and multiple "cry for attention" attempts, and multiple hospitalizations (latest [facility] 2012), and no history of Self Injurious Behavior.   Previous Psychotropic Medications: Yes Lexapro, Depakote, Seroquel, Haldol, Abilify, and Zyprexa.  Substance Abuse History in the last 12 months:  Yes.    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 IRRIGATION AND DEBRIDEMENT WITH NAIL REMOVAL AND REPAIR OF NAIL BED.;  Surgeon: Terance Hart, MD;  Location: [facility] OR;  Service:  Orthopedics;  Laterality: Left;   TENDON REPAIR      Family Psychiatric History: Mostly unknown due to being adopted however mother does have some diagnoses.  Family History: History reviewed. No pertinent family history.  Social History:   Social History   Socioeconomic History   Marital status: Married    Spouse name: Not on file   Number of children: Not on file   Years of education: 13   Highest education level: Not on file  Occupational History   Occupation: emt    Employer: [employer]  Tobacco Use   Smoking status: Former    Types: Pipe    Quit date: 05/02/2020    Years since quitting: 2.1   Smokeless tobacco: Never  Vaping Use   Vaping Use: Never used  Substance and Sexual Activity   Alcohol use: No   Drug use: Not Currently    Types: Marijuana   Sexual activity: Not on file  Other Topics Concern   Not on file  Social History Narrative   Not on file   Social Determinants of Health   Financial Resource Strain: Not on file  Food Insecurity: Not on file  Transportation Needs: Not on file  Physical Activity: Not on file  Stress: Not on file  Social Connections: Not on file    Additional Social History: None  Allergies:  No Known Allergies  Metabolic Disorder Labs: Lab Results  Component Value Date   HGBA1C 6.0 05/26/2022   No results found for: "PROLACTIN" Lab Results  Component Value Date   CHOL 198 05/26/2022   TRIG 71.0 05/26/2022   HDL 65.20 05/26/2022   CHOLHDL 3 05/26/2022   VLDL 14.2 05/26/2022   LDLCALC 119 (H) 05/26/2022   LDLCALC 133 (H) 11/21/2020   Lab Results  Component Value Date   TSH 3.18 05/26/2022    Therapeutic Level Labs: No results found for: "LITHIUM" No results found for: "CBMZ" No results found for: "VALPROATE"  Current Medications: Current Outpatient Medications  Medication Sig Dispense Refill   escitalopram (LEXAPRO) 10 MG tablet Take 1 tablet (10 mg total) by mouth daily. Take half tablet (5 mg) for 4 days  then take a whole tablet (10 mg) daily 30 tablet 1   OLANZapine (ZYPREXA) 5 MG tablet Take 1 tablet (5 mg total) by mouth at bedtime. Take a half tablet (2.5 mg) for 2 nights then take a whole tablet (5 mg) at night 30 tablet 1   atorvastatin (LIPITOR) 20  MG tablet Take 1 tablet (20 mg total) by mouth daily. 90 tablet 3   sildenafil (VIAGRA) 100 MG tablet TAKE ONE-HALF TO ONE (0.5-1) TABLET BY MOUTH DAILY AS NEEDED FOR ERECTILE DYSFUNCTION (Patient not taking: Reported on 05/26/2022) 5 tablet 11   valACYclovir (VALTREX) 1000 MG tablet Take 1 tablet (1,000 mg total) by mouth 2 (two) times daily. (Patient not taking: Reported on 11/21/2020) 14 tablet 3   No current facility-administered medications for this visit.    Musculoskeletal: Strength & Muscle Tone: within normal limits Gait & Station: normal Patient leans: N/A  Psychiatric Specialty Exam: Review of Systems  Respiratory:  Negative for cough and shortness of breath.   Cardiovascular:  Negative for chest pain.  Gastrointestinal:  Negative for abdominal pain, constipation, diarrhea, nausea and vomiting.  Neurological:  Negative for weakness and headaches.  Psychiatric/Behavioral:  Positive for dysphoric mood. Negative for agitation, decreased concentration, hallucinations, self-injury, sleep disturbance and suicidal ideas. The patient is nervous/anxious. The patient is not hyperactive.     Blood pressure 111/69, pulse (!) 52, height 5\' 11"  (1.803 m), weight 198 lb (89.8 kg), SpO2 100 %.Body mass index is 27.62 kg/m.  General Appearance: Casual and Fairly Groomed  Eye Contact:  Good  Speech:  Clear and Coherent and Normal Rate  Volume:  Normal  Mood:  Depressed  Affect:  Congruent and Depressed  Thought Process:  Coherent and Goal Directed  Orientation:  Full (Time, Place, and Person)  Thought Content:  Logical  Suicidal Thoughts:  No  Homicidal Thoughts:  No  Memory:  Immediate;   Good Recent;   Good  Judgement:  Good  Insight:   Good  Psychomotor Activity:  Normal  Concentration:  Concentration: Good and Attention Span: Good  Recall:  Good  Fund of Knowledge:Good  Language: Good  Akathisia:  Negative  Handed:  Right  AIMS (if indicated):  not done  Assets:  Communication Skills Desire for Improvement Housing Physical Health Resilience Social Support  ADL's:  Intact  Cognition: WNL  Sleep:  Fair   Screenings: GAD-7    Visit from 06/25/2022 in [facility] Counselor from 04/08/2022 in [facility]  Total GAD-7 Score 17 18      PHQ2-9    Flowsheet Row Office Visit from 06/25/2022 in [facility] Office Visit from 05/26/2022 in [facility] Counselor from 04/08/2022 in [facility] Office Visit from 11/21/2020 in [facility] Office Visit from 12/03/2017 in [facility]  PHQ-2 Total Score 3 0 6 0 0  PHQ-9 Total Score 16 2 21  -- --      Flowsheet Row Counselor from 04/08/2022 in [facility]  C-SSRS RISK CATEGORY No Risk       Assessment and Plan:  Andre Morgan is a 51 yr old male who presents to establish care and for medication management.  PPHx is significant for Schizoaffective Disorder, Bipolar Type, Depression, Anxiety, ECT in 2014, one Suicide Attempt (age 89 cut wrists) and multiple "cry for attention" attempts, and multiple hospitalizations (latest [facility] 2012), and no history of Self Injurious Behavior.   Andre Morgan does meet criteria for MDD based on his PHQ-9 score of 16 and also has a GAD-7 score of 17.  Given his reported history of psychotic symptoms along with manic episodes he does meet criteria for schizoaffective disorder bipolar type.  Due  to this we will start Zyprexa to provide mood stability and for psychosis.  Though he did have symptoms that sound like EPS in  the past these were most likely from the first generation antipsychotic Haldol so that is why we will trial a second generation antipsychotic.  He will start this for 2 days then increase to 5 mg nightly dose. If tolerating well and experiencing continued psychiatric stability after 1 week, instructed patient that he may start Lexapro for depression and anxiety.  He will return for follow-up in 4 weeks at that time we will most likely increase his Zyprexa to more therapeutic dose at that time.  He has had recent lab work from his PCP done 7/25- A1c: 6.0,  TSH: WNL,  Lipid Panel: WNL except LDL: 119,  Hep Function: WNL except AST: 40.  EKG from 05/09/2019- Sinus Bradycardia with Qtc: 378.   He will obtain a new EKG at his next PCP appointment.   Schizoaffective Disorder, Bipolar Type  MDD : -Start Zyprexa 2.5 mg for 2 days then increase to 5 mg QHS for psychosis, mood stability, augmentation.  30 tablets with 1 refill. -After 1 week of taking Zyprexa, may start Lexapro 5 mg for 4 days then increase to 10 mg daily for depression and anxiety. 30 tablets with 1 refill.   Collaboration of Care: Medication Management [facility], Psychiatrist [facility], and Referral or follow-up with counselor/therapist [facility]  Patient/Guardian was advised Release of Information must be obtained prior to any record release in order to collaborate their care with an outside provider. Patient/Guardian was advised if they have not already done so to contact the registration department to sign all necessary forms in order for [place] to release information regarding their care.   Consent: Patient/Guardian gives verbal consent for treatment and assignment of benefits for services provided during this visit. Patient/Guardian expressed understanding and agreed to proceed.   Lauro Franklin, MD 8/24/20239:56 AM   Patient without current signs or symptoms of psychosis or mania although reports symptoms consistent with likely  manic episode 3 months ago that self-resolved.  May correlate with cessation of alcohol and substance use and will be helpful to continue to monitor psychiatric symptoms if abstinence is maintained.  Given concern for diagnosis of schizoaffective disorder, it is reasonable to initiate antipsychotic medication for control of symptoms of psychosis as well as mood stabilization.  Favor second generation antipsychotic given concern for past EPS as well as potential for anxiolytic and mood benefits.  Given patient's strong desire for medication specifically for mood and anxiety however discussed option to start Lexapro - however instructed to wait 1 week after starting Zyprexa to provide coverage to prevent emergence of mania.  No acute safety concerns at this time.  I reviewed the patient's chart and discussed the patient and plan of care with the resident. I agree with the findings and plan as documented in the resident's note and above addendum.   Daine Gip, MD 06/25/22

## 2022-06-26 NOTE — Progress Notes (Signed)
After further consideration and given the recent timing of his manic episode called him this morning to discuss changes to the medication regimen.  Discussed with him that it would be safer to start the Zyprexa for 1 whole week prior to beginning the Lexapro.  Discussed with him that this would ensure proper mood stabilization to reduce the risk of triggering a manic episode with the Lexapro.  He reported understanding and will take the Zyprexa for 7 days prior to starting the Lexapro.  Discussed the importance of monitoring for symptoms of mania and to call if he notices any of these.  He reports understanding and had no other concerns at present.  I will see him back in the office in approximately 4 weeks.   Plan -Zyprexa 2.5 mg tonight 8/25 then increase to 5 mg daily. -On 8/31 Start Lexapro 5 mg for 4 days then increase to 10 mg daily.   Arna Snipe MD Resident

## 2022-06-27 NOTE — Plan of Care (Signed)
  Problem: Depression CCP Problem  1  Goal: LTG: Lin WILL SCORE LESS THAN 10 ON THE PATIENT HEALTH QUESTIONNAIRE (PHQ-9) Outcome: Progressing Goal: STG: Reduce overall depression score by a minimum of 25% on the Patient Health Questionnaire (PHQ-9) or the Montgomery-Asberg Depression Rating Scale (MADRS) Outcome: Progressing Goal: STG: Powell WILL IDENTIFY 3 COGNITIVE PATTERNS AND BELIEFS THAT SUPPORT DEPRESSION Outcome: Progressing   

## 2022-07-21 ENCOUNTER — Encounter (HOSPITAL_COMMUNITY): Payer: No Payment, Other | Admitting: Student in an Organized Health Care Education/Training Program

## 2022-07-21 NOTE — Progress Notes (Signed)
Chart Opened in error    Briant Cedar, MD 07/21/2022, 8:19 AM

## 2022-07-24 ENCOUNTER — Ambulatory Visit (INDEPENDENT_AMBULATORY_CARE_PROVIDER_SITE_OTHER): Payer: No Payment, Other | Admitting: Student in an Organized Health Care Education/Training Program

## 2022-07-24 ENCOUNTER — Ambulatory Visit (INDEPENDENT_AMBULATORY_CARE_PROVIDER_SITE_OTHER): Payer: Self-pay | Admitting: Clinical

## 2022-07-24 ENCOUNTER — Encounter (HOSPITAL_COMMUNITY): Payer: Self-pay | Admitting: Student in an Organized Health Care Education/Training Program

## 2022-07-24 DIAGNOSIS — F250 Schizoaffective disorder, bipolar type: Secondary | ICD-10-CM

## 2022-07-24 DIAGNOSIS — F331 Major depressive disorder, recurrent, moderate: Secondary | ICD-10-CM | POA: Diagnosis not present

## 2022-07-24 MED ORDER — OLANZAPINE 5 MG PO TABS: 5.0000 mg | ORAL_TABLET | Freq: Every day | ORAL | 1 refills | Status: DC

## 2022-07-24 MED ORDER — ESCITALOPRAM OXALATE 10 MG PO TABS
10.0000 mg | ORAL_TABLET | Freq: Every day | ORAL | 1 refills | Status: DC
Start: 2022-07-24 — End: 2022-09-07

## 2022-07-24 MED ORDER — ESCITALOPRAM OXALATE 10 MG PO TABS: 10.0000 mg | ORAL_TABLET | Freq: Every day | ORAL | 1 refills | Status: DC

## 2022-07-24 NOTE — Progress Notes (Signed)
BH MD/PA/NP OP Progress Note  07/24/2022 12:13 PM Andre Morgan  MRN:  [id number]  Chief Complaint:  Chief Complaint  Patient presents with   Follow-up   HPI:  Andre Morgan is a 51 yr old male who presents for follow up and for medication management.  PPHx is significant for Schizoaffective Disorder, Bipolar Type, Depression, Anxiety, ECT in 2014, one Suicide Attempt (age 32 cut wrists) and multiple "cry for attention" attempts, and multiple hospitalizations (latest [facility] 2012), and no history of Self Injurious Behavior.    He reports that he has noticed improvement with the medications.  He reports that his mood has been more stable and he has been less depressed.  He reports he is just frustrated with himself.  He reports that he thought he had everything figured out with religion now and the life of these being manic episodes he is unsure of things.  He reports that "I am in the way of myself."  He reports he knows things take time to change and will continue to go to therapy and take his medications but questions the time lost when he was not doing most things.  He reports having some sedation with his medicine but also reports that his job hours have shifted to where he is essentially doing third shift.  He reports that he is planning to look for different work.  He is agreeable to not making any medication changes at this time.  He reports his sleep is okay.  He reports his appetite is doing good.  He reports no SI, HI, or AVH.  He will return for follow-up in approximately 6 weeks.    Visit Diagnosis:    ICD-10-CM   1. Schizoaffective disorder, bipolar type ([place])  F25.0 OLANZapine (ZYPREXA) 5 MG tablet    escitalopram (LEXAPRO) 10 MG tablet    DISCONTINUED: escitalopram (LEXAPRO) 10 MG tablet    2. Major depressive disorder, recurrent episode, moderate with anxious distress ([facility])  F33.1 OLANZapine (ZYPREXA) 5 MG tablet    escitalopram (LEXAPRO) 10 MG tablet     DISCONTINUED: escitalopram (LEXAPRO) 10 MG tablet      Past Psychiatric History: Schizoaffective Disorder, Bipolar Type, Depression, Anxiety, and Bipolar Disorder, ECT in 2014, one Suicide Attempt (age 10 cut wrists) and multiple "cry for attention" attempts, and multiple hospitalizations (latest [facility] 2012), and no history of Self Injurious Behavior.   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 IRRIGATION AND DEBRIDEMENT WITH NAIL REMOVAL AND REPAIR OF NAIL BED.;  Surgeon: Erle Crocker, MD;  Location: [facility];  Service: Orthopedics;  Laterality: Left;   TENDON REPAIR      Family Psychiatric History: Mostly unknown due to being adopted however mother does have some diagnoses.  Family History: History reviewed. No pertinent family history.  Social History:  Social History   Socioeconomic History   Marital status: Married    Spouse name: Not on file   Number of children: Not on file   Years of education: 13   Highest education level: Not on file  Occupational History   Occupation:  emt    Employer:   Tobacco Use   Smoking status: Former    Types: Pipe    Quit date: 05/02/2020    Years since quitting: 2.2   Smokeless tobacco: Never  Vaping Use   Vaping Use: Never used  Substance and Sexual Activity   Alcohol use: No   Drug use: Not Currently    Types: Marijuana   Sexual activity: Not on file  Other Topics Concern   Not on file  Social History Narrative   Not on file   Social Determinants of Health   Financial Resource Strain: Not on file  Food Insecurity: Not on  file  Transportation Needs: Not on file  Physical Activity: Not on file  Stress: Not on file  Social Connections: Not on file    Allergies: No Known Allergies  Metabolic Disorder Labs: Lab Results  Component Value Date   HGBA1C 6.0 05/26/2022   No results found for: "PROLACTIN" Lab Results  Component Value Date   CHOL 198 05/26/2022   TRIG 71.0 05/26/2022   HDL 65.20 05/26/2022   CHOLHDL 3 05/26/2022   VLDL 14.2 05/26/2022   LDLCALC 119 (H) 05/26/2022   LDLCALC 133 (H) 11/21/2020   Lab Results  Component Value Date   TSH 3.18 05/26/2022   TSH 2.91 11/21/2020    Therapeutic Level Labs: No results found for: "LITHIUM" No results found for: "VALPROATE" No results found for: "CBMZ"  Current Medications: Current Outpatient Medications  Medication Sig Dispense Refill   atorvastatin (LIPITOR) 20 MG tablet Take 1 tablet (20 mg total) by mouth daily. 90 tablet 3   escitalopram (LEXAPRO) 10 MG tablet Take 1 tablet (10 mg total) by mouth daily. 30 tablet 1   OLANZapine (ZYPREXA) 5 MG tablet Take 1 tablet (5 mg total) by mouth at bedtime. Take a half tablet (2.5 mg) for 2 nights then take a whole tablet (5 mg) at night 30 tablet 1   sildenafil (VIAGRA) 100 MG tablet TAKE ONE-HALF TO ONE (0.5-1) TABLET BY MOUTH DAILY AS NEEDED FOR ERECTILE DYSFUNCTION (Patient not taking: Reported on 05/26/2022) 5 tablet 11   valACYclovir (VALTREX) 1000 MG tablet Take 1 tablet (1,000 mg total) by mouth 2 (two) times daily. (Patient not taking: Reported on 11/21/2020) 14 tablet 3   No current facility-administered medications for this visit.     Musculoskeletal: Strength & Muscle Tone: within normal limits Gait & Station: normal Patient leans: N/A  Psychiatric Specialty Exam: Review of Systems  Respiratory:  Negative for cough and shortness of breath.   Cardiovascular:  Negative for chest pain.  Gastrointestinal:  Negative for abdominal pain, constipation, diarrhea, nausea and vomiting.   Neurological:  Negative for weakness and headaches.  Psychiatric/Behavioral:  Positive for dysphoric mood. Negative for agitation, hallucinations, self-injury, sleep disturbance and suicidal ideas. The patient is not nervous/anxious.     Blood pressure 123/69, pulse (!) 59, height 5\' 11"  (1.803 m), weight 207 lb 9.6 oz (94.2 kg), SpO2 100 %.Body mass index is 28.95 kg/m.  General Appearance: Casual  Eye Contact:  Good  Speech:  Clear and Coherent and Normal Rate  Volume:  Normal  Mood:  Dysphoric  Affect:  Congruent  Thought Process:  Coherent and Goal Directed  Orientation:  Full (Time, Place, and Person)  Thought Content: Logical   Suicidal Thoughts:  No  Homicidal Thoughts:  No  Memory:  Immediate;   Good Recent;   Good  Judgement:  Fair  Insight:  Fair  Psychomotor Activity:  Normal  Concentration:  Concentration: Good and Attention Span: Good  Recall:  Good  Fund of Knowledge: Good  Language: Good  Akathisia:  Negative  Handed:  Right  AIMS (if indicated): done AIMS=0  Assets:  Communication Skills Desire for Improvement Housing Physical Health Resilience Social Support  ADL's:  Intact  Cognition: WNL  Sleep:  Fair   Screenings: GAD-7    Personnel officer Visit from 06/25/2022 in [facility] Counselor from 04/08/2022 in [facility]  Total GAD-7 Score 17 18      PHQ2-9    [place] Office Visit from 06/25/2022 in [facility] Office Visit from 05/26/2022 in [facility] [place] from 04/08/2022 in [facility] Office Visit from 11/21/2020 in [facility] Visit from 12/03/2017 in [facility]  PHQ-2 Total Score 3 0 6 0 0  PHQ-9 Total Score 16 2 21  -- --      Flowsheet Row Counselor from 04/08/2022 in [facility] No Risk         Assessment and Plan:  RAUDEL CUNIGAN is a 51 yr old male who presents for follow up and for medication management.  PPHx is significant for Schizoaffective Disorder, Bipolar Type, Depression, Anxiety, ECT in 2014, one Suicide Attempt (age 89 cut wrists) and multiple "cry for attention" attempts, and multiple hospitalizations (latest [facility] 2012), and no history of Self Injurious Behavior.    Alija has responded well to starting Zyprexa and had started taking the Lexapro.  He is currently struggling with "redefining himself" but he is going to therapy which should be helpful with this.  We will not make any medication changes at this time.  If he continues to have issues with sedation may consider changing Zyprexa.  He will return for follow up in approximately 6 weeks.   Schizoaffective Disorder, Bipolar Type  MDD : -Continue Zyprexa 5 mg QHS for psychosis, mood stability, augmentation.  30 tablets with 1 refill. -Continue Lexapro 10 mg daily for depression and anxiety. 30 tablets with 1 refill.    Collaboration of Care:   Patient/Guardian was advised Release of Information must be obtained prior to any record release in order to collaborate their care with an outside provider. Patient/Guardian was advised if they have not already done so to contact the registration department to sign all necessary forms in order for [place] to release information regarding their care.   Consent: Patient/Guardian gives verbal consent for treatment and assignment of benefits for services provided during this visit. Patient/Guardian expressed understanding and agreed to proceed.    Briant Cedar, MD 07/24/2022, 12:13 PM

## 2022-07-24 NOTE — Progress Notes (Signed)
   THERAPIST PROGRESS NOTE  Session Time: 30 minutes  Participation Level: Active  Behavioral Response: CasualAlertDepressed  Type of Therapy: Individual Therapy  Treatment Goals addressed: Client will identify 3 cognitive patterns and believes that support depression  ProgressTowards Goals: Progressing  Interventions: CBT and Supportive  Summary:  Andre Morgan is a 51 y.o. male who presents for the scheduled appointment oriented x5, appropriately dressed, and friendly.  Client denied hallucinations and delusions. Client reported on today he has been maintaining fairly well.  Client reported after his last therapy session he met with the psychiatrist and began medication management.  Client reported his appointment was very insightful and he came to the distinction by the psychiatrist information that he was going through manic episode.  Client reported diagnosis was upsetting in some ways because after reflecting over his previous thoughts and behaviors wondering if it was mania causing him to react the way he was over God leading him to do certain things.  Client reported he can point out that he was doing things that were not rational and it was brought to his attention after speaking with his son previously that his son was concerned about him.  Client reported he has also been thinking about his overall demeanor.  Client reported his baseline is presenting with a flat affect and not very expressive.  Client reported he has been wondering if it is normal that he has lack of emotion whether it be happy or sad towards the events that other people would normally react to.  Client reporting having a "I do not care" perspective towards a lot of things.  Client reported he has been compliant with medications but it has been causing him to feel drowsy.  Client reported he will speak with a psychiatrist at his appointment today about it. Evidence of progress towards goal: Client reported he is  medication compliant 7 out of 7 days a week.  Client identified at least 1 example of cognitive pattern that supports depression and was also an example of his current diagnosis.   Suicidal/Homicidal: Nowithout intent/plan  Therapist Response:  Therapist began the appointment asking client how he has been doing since last seen. Therapist used CBT to engage using active listening and positive emotional support. Therapist used CBT discussed the client open-ended questions about his response to medication management with his mood and/or side effects. Therapist used CBT to allow the client time to discuss his thoughts on his diagnosis and how it has changed his perspective on his behaviors leading up to this time. Therapist used CBT ask the client to identify his progress with frequency of use with coping skills with continued practice in his daily activity.    Therapist assigned the client homework to continue medication compliance and practice mindfulness.   Plan: Return again in 5 weeks.  Diagnosis: Schizoaffective disorder, bipolar type  Collaboration of Care: Patient refused AEB none requested by the client at this time.  Patient/Guardian was advised Release of Information must be obtained prior to any record release in order to collaborate their care with an outside provider. Patient/Guardian was advised if they have not already done so to contact the registration department to sign all necessary forms in order for [place] to release information regarding their care.   Consent: Patient/Guardian gives verbal consent for treatment and assignment of benefits for services provided during this visit. Patient/Guardian expressed understanding and agreed to proceed.   [place], LCSW 07/24/2022

## 2022-07-24 NOTE — Plan of Care (Signed)
  Problem: Depression CCP Problem  1  Goal: LTG: Andre Morgan WILL SCORE LESS THAN 10 ON THE PATIENT HEALTH QUESTIONNAIRE (PHQ-9) Outcome: Progressing Goal: STG: Reduce overall depression score by a minimum of 25% on the Patient Health Questionnaire (PHQ-9) or the Montgomery-Asberg Depression Rating Scale (MADRS) Outcome: Progressing Goal: STG: Andre Morgan WILL IDENTIFY 3 COGNITIVE PATTERNS AND BELIEFS THAT SUPPORT DEPRESSION Outcome: Progressing

## 2022-08-04 ENCOUNTER — Ambulatory Visit (HOSPITAL_COMMUNITY): Payer: Self-pay | Admitting: Clinical

## 2022-09-04 ENCOUNTER — Encounter (HOSPITAL_COMMUNITY): Payer: 59 | Admitting: Student in an Organized Health Care Education/Training Program

## 2022-09-07 ENCOUNTER — Encounter (HOSPITAL_COMMUNITY): Payer: Self-pay | Admitting: Psychiatry

## 2022-09-07 ENCOUNTER — Ambulatory Visit (INDEPENDENT_AMBULATORY_CARE_PROVIDER_SITE_OTHER): Payer: 59 | Admitting: Psychiatry

## 2022-09-07 DIAGNOSIS — F331 Major depressive disorder, recurrent, moderate: Secondary | ICD-10-CM

## 2022-09-07 DIAGNOSIS — F411 Generalized anxiety disorder: Secondary | ICD-10-CM

## 2022-09-07 DIAGNOSIS — F1290 Cannabis use, unspecified, uncomplicated: Secondary | ICD-10-CM

## 2022-09-07 DIAGNOSIS — F250 Schizoaffective disorder, bipolar type: Secondary | ICD-10-CM

## 2022-09-07 MED ORDER — ESCITALOPRAM OXALATE 10 MG PO TABS: 10.0000 mg | ORAL_TABLET | Freq: Every day | ORAL | 3 refills | Status: DC

## 2022-09-07 MED ORDER — OLANZAPINE 10 MG PO TABS
10.0000 mg | ORAL_TABLET | Freq: Every day | ORAL | 3 refills | Status: DC
Start: 2022-09-07 — End: 2023-12-23

## 2022-09-07 MED ORDER — HYDROXYZINE HCL 10 MG PO TABS: 10.0000 mg | ORAL_TABLET | Freq: Three times a day (TID) | ORAL | 3 refills | Status: DC | PRN

## 2022-09-07 NOTE — Progress Notes (Signed)
BH MD/PA/NP OP Progress Note Virtual Visit via Telephone Note  I connected with Andre Morgan on 09/07/22 at  8:00 AM EST by telephone and verified that I am speaking with the correct person using two identifiers.  Location: Patient: home Provider: Clinic   I discussed the limitations, risks, security and privacy concerns of performing an evaluation and management service by telephone and the availability of in person appointments. I also discussed with the patient that there may be a patient responsible charge related to this service. The patient expressed understanding and agreed to proceed.   I provided 30 minutes of non-face-to-face time during this encounter.  09/07/2022 8:45 AM Andre Morgan  MRN:  [id number]  Chief Complaint: " I do not know if my medications are working"  HPI: 51 year old male seen today for follow-up psychiatric evaluation.  He has a psychiatric history of schizoaffective disorder bipolar type, depression, anxiety, SA (age 10 and attempted to cut wrists), and a history of self injures behaviors.  He also received ECT in 2014.  Patient is currently managed on Zyprexa 5 mg nightly and Lexapro 10 mg daily.  He notes his medications are somewhat effective in managing his psychiatric conditions.  Today he is well-groomed, pleasant, cooperative,and  engaged in conversation.  He informed Clinical research associate that he is unsure of his medications are working.  He notes that his wife believes that Lexapro is ineffective and Zyprexa is more effective.  Patient notes that he continues to have episodes of depression and anxiety.  He notes that he worries about his 8 children, his wife, and his job as a Scientist, water quality.  Today provider conducted a GAD-7 and patient scored a 20.  Provider also conducted PHQ-9 and patient scored a 23.  He endorses increased appetite and notes that he has gained 20 pounds within the last 3 months.  He also endorses poor sleep: He reports that he sleeps 3 to 4 hours  nightly.  Today he endorses passive SI but denies wanting to harm himself.  Patient notes that he at times does erratic things at work. He however notes that his behavior is not noticed because he works alone as a Agricultural engineer.  Most days patient notes that he feels paranoid and delusional.  He notes that he believes spirits are out to get him.  He denies having visual or auditory hallucination however notes that he constantly feels that someone is near.  Provider asked patient if he has been using alcohol or illegal substances.  Patient reports that recently he started smoking marijuana again to help manage his anxiety.  Provider informed patient that marijuana use can exacerbate paranoia and delusional thinking.  He endorsed understanding.  Provider recommended that patient reduce/discontinue his consumption.  He endorsed understanding.   Today he is agreeable to increasing Zyprexa 5 mg to 10 mg to help manage symptoms of delusion, paranoia, and sleep.  He will start hydroxyzine 10 mg 3 times daily as needed to help manage anxiety.  Patient notes that he is unsure if he finds Lexapro effective but is agreeable to continuing it at this time.  No other concerns noted at this time Visit Diagnosis:    ICD-10-CM   1. Generalized anxiety disorder  F41.1 hydrOXYzine (ATARAX) 10 MG tablet    escitalopram (LEXAPRO) 10 MG tablet    2. Schizoaffective disorder, bipolar type (HCC)  F25.0 OLANZapine (ZYPREXA) 10 MG tablet    escitalopram (LEXAPRO) 10 MG tablet    3. Major depressive disorder, recurrent  episode, moderate with anxious distress (HCC)  F33.1     4. Marijuana use  F12.90       Past Psychiatric History: Schizoaffective Disorder, Bipolar Type, Depression, Anxiety, ECT in 2014, one Suicide Attempt (age 40 cut wrists) and multiple "cry for attention" attempts, and multiple hospitalizations (latest [facility] 2012), and no history of Self Injurious Behavior.    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 IRRIGATION AND DEBRIDEMENT WITH NAIL REMOVAL AND REPAIR OF NAIL BED.;  Surgeon: Erle Crocker, MD;  Location: [facility];  Service: Orthopedics;  Laterality: Left;   TENDON REPAIR      Family Psychiatric History: Mostly unknown due to being adopted however mother does have some diagnoses.   Family History: No family history on file.  Social History:  Social History   Socioeconomic History   Marital status: Married    Spouse name: Not on file   Number of children: Not on file   Years of education: 13   Highest education level: Not on file  Occupational History   Occupation: emt    Employer: [employer]  Tobacco Use   Smoking status: Former    Types: Pipe    Quit date: 05/02/2020    Years since quitting: 2.3   Smokeless tobacco: Never  Vaping Use   Vaping Use: Never used  Substance and Sexual Activity   Alcohol use: No   Drug use: Not Currently    Types: Marijuana   Sexual activity: Not on file  Other Topics Concern   Not on file  Social History Narrative   Not on file   Social Determinants of Health   Financial Resource Strain: Not on file  Food Insecurity: Not on file  Transportation Needs: Not on file  Physical Activity: Not on file  Stress: Not on file  Social Connections: Not on file    Allergies: No Known Allergies  Metabolic Disorder Labs: Lab Results  Component Value Date   HGBA1C 6.0 05/26/2022   No results found for: "PROLACTIN" Lab Results  Component Value Date   CHOL 198 05/26/2022   TRIG 71.0 05/26/2022   HDL 65.20 05/26/2022   CHOLHDL 3 05/26/2022    VLDL 14.2 05/26/2022   LDLCALC 119 (H) 05/26/2022   LDLCALC 133 (H) 11/21/2020   Lab Results  Component Value Date   TSH 3.18 05/26/2022   TSH 2.91 11/21/2020    Therapeutic Level Labs: No results found for: "LITHIUM" No results found for: "VALPROATE" No results found for: "CBMZ"  Current Medications: Current Outpatient Medications  Medication Sig Dispense Refill   hydrOXYzine (ATARAX) 10 MG tablet Take 1 tablet (10 mg total) by mouth 3 (three) times daily as needed. 90 tablet 3   atorvastatin (LIPITOR) 20 MG tablet Take 1 tablet (20 mg total) by mouth daily. 90 tablet 3   escitalopram (LEXAPRO) 10 MG tablet Take 1 tablet (10 mg total) by mouth daily. 30 tablet 3   OLANZapine (ZYPREXA) 10 MG tablet Take 1 tablet (10 mg total) by mouth at bedtime. Take a half tablet (2.5 mg) for 2 nights then  take a whole tablet (5 mg) at night 30 tablet 3   sildenafil (VIAGRA) 100 MG tablet TAKE ONE-HALF TO ONE (0.5-1) TABLET BY MOUTH DAILY AS NEEDED FOR ERECTILE DYSFUNCTION (Patient not taking: Reported on 05/26/2022) 5 tablet 11   valACYclovir (VALTREX) 1000 MG tablet Take 1 tablet (1,000 mg total) by mouth 2 (two) times daily. (Patient not taking: Reported on 11/21/2020) 14 tablet 3   No current facility-administered medications for this visit.     Musculoskeletal: Strength & Muscle Tone:  Unable to assess due to telehealth visit Gait & Station:  Unable to assess due to telehealth visit Patient leans: N/A  Psychiatric Specialty Exam: Review of Systems  There were no vitals taken for this visit.There is no height or weight on file to calculate BMI.  General Appearance:  Unable to assess due to telehealth visit  Eye Contact:   Unable to assess due to telehealth visit  Speech:  Clear and Coherent and Normal Rate  Volume:  Normal  Mood:  Anxious and Depressed  Affect:  Appropriate and Congruent  Thought Process:  Coherent, Goal Directed, and Linear  Orientation:  Full (Time, Place, and  Person)  Thought Content: Logical, Delusions, and Paranoid Ideation   Suicidal Thoughts:  No  Homicidal Thoughts:  No  Memory:  Immediate;   Good Recent;   Good Remote;   Good  Judgement:  Good  Insight:  Good  Psychomotor Activity:   Unable to assess due to telehealth visit  Concentration:  Concentration: Fair and Attention Span: Fair  Recall:  Good  Fund of Knowledge: Good  Language: Good  Akathisia:   Unable to assess due to telehealth visit  Handed:  Right  AIMS (if indicated): not done  Assets:  Communication Skills Desire for Improvement Financial Resources/Insurance Housing Intimacy Physical Health Social Support Talents/Skills  ADL's:  Intact  Cognition: WNL  Sleep:  Fair   Screenings: GAD-7    Flowsheet Row Clinical Support from 09/07/2022 in [facility] Office Visit from 06/25/2022 in [facility] Counselor from 04/08/2022 in [facility]  Total GAD-7 Score 20 17 18       PHQ2-9    Flowsheet Row Clinical Support from 09/07/2022 in [facility] Office Visit from 06/25/2022 in [facility] Office Visit from 05/26/2022 in [facility] Counselor from 04/08/2022 in [facility] Office Visit from 11/21/2020 in [facility]  PHQ-2 Total Score 5 3 0 6 0  PHQ-9 Total Score 23 16 2 21  --      Flowsheet Row Clinical Support from 09/07/2022 in [facility] Counselor from 04/08/2022 in [facility]  C-SSRS RISK CATEGORY Error: Q7 should not be populated when Q6 is No No Risk        Assessment and Plan: Patient endorses symptoms of delusional thinking, paranoia, anxiety, depression, insomnia and marijuana use.  Today he is agreeable to increasing Zyprexa 5 mg to 10 mg to help manage symptoms of delusion, paranoia, and  sleep.  He will start hydroxyzine 10 mg 3 times daily as needed to help manage anxiety.  Patient notes that he is unsure if he finds Lexapro effective but is agreeable to continuing it at this time.  1. Schizoaffective disorder, bipolar type (HCC)  Increased- OLANZapine (ZYPREXA) 10 MG tablet; Take 1 tablet (10 mg total) by mouth at bedtime. Take a half  tablet (2.5 mg) for 2 nights then take a whole tablet (5 mg) at night  Dispense: 30 tablet; Refill: 3 Continue- escitalopram (LEXAPRO) 10 MG tablet; Take 1 tablet (10 mg total) by mouth daily.  Dispense: 30 tablet; Refill: 3  2. Generalized anxiety disorder  Start- hydrOXYzine (ATARAX) 10 MG tablet; Take 1 tablet (10 mg total) by mouth 3 (three) times daily as needed.  Dispense: 90 tablet; Refill: 3 Continue- escitalopram (LEXAPRO) 10 MG tablet; Take 1 tablet (10 mg total) by mouth daily.  Dispense: 30 tablet; Refill: 3  3. Marijuana use    Collaboration of Care: Collaboration of Care: Other provider involved in patient's care AEB primary psychiatric provider  Patient/Guardian was advised Release of Information must be obtained prior to any record release in order to collaborate their care with an outside provider. Patient/Guardian was advised if they have not already done so to contact the registration department to sign all necessary forms in order for [place] to release information regarding their care.   Consent: Patient/Guardian gives verbal consent for treatment and assignment of benefits for services provided during this visit. Patient/Guardian expressed understanding and agreed to proceed.   Follow-up in 3 months   Shanna Cisco, NP 09/07/2022, 8:45 AM

## 2022-12-11 ENCOUNTER — Encounter (HOSPITAL_COMMUNITY): Payer: 59 | Admitting: Student in an Organized Health Care Education/Training Program

## 2022-12-11 NOTE — Progress Notes (Deleted)
[facility] MD/PA/NP OP Progress Note  12/11/2022 7:02 AM Daemien Busse  MRN:  [id number]  Chief Complaint: No chief complaint on file.  HPI:  ATIF ZAPPONE is a 52 yr old male who presents for Follow Up and Medication Management.  PPHx is significant for Schizoaffective Disorder, Bipolar Type, Depression, Anxiety, ECT in 2014, one Suicide Attempt (age 55 cut wrists) and multiple "cry for attention" attempts, and multiple hospitalizations (latest [facility] 2012), and no history of Self Injurious Behavior.     ***   Visit Diagnosis: No diagnosis found.  Past Psychiatric History: Schizoaffective Disorder, Bipolar Type, Depression, Anxiety, ECT in 2014, one Suicide Attempt (age 15 cut wrists) and multiple "cry for attention" attempts, and multiple hospitalizations (latest [facility] 2012), and no history of Self Injurious Behavior.   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 IRRIGATION AND DEBRIDEMENT WITH NAIL REMOVAL AND REPAIR OF NAIL BED.;  Surgeon: Erle Crocker, MD;  Location: [facility];  Service: Orthopedics;  Laterality: Left;   TENDON REPAIR      Family Psychiatric History: ***  Family History: No family history on file.  Social History:  Social History   Socioeconomic History   Marital status: Married    Spouse name: Not on file   Number of children: Not on file   Years of education: 13   Highest education level: Not on file  Occupational History   Occupation: emt    Employer:   Tobacco Use   Smoking status: Former    Types: Pipe    Quit date: 05/02/2020    Years  since quitting: 2.6   Smokeless tobacco: Never  Vaping Use   Vaping Use: Never used  Substance and Sexual Activity   Alcohol use: No   Drug use: Not Currently    Types: Marijuana   Sexual activity: Not on file  Other Topics Concern   Not on file  Social History Narrative   Not on file   Social Determinants of Health   Financial Resource Strain: Not on file  Food Insecurity: Not on file  Transportation Needs: Not on file  Physical Activity: Not on file  Stress: Not on file  Social Connections: Not on file    Allergies: No Known Allergies  Metabolic Disorder Labs: Lab Results  Component Value Date   HGBA1C 6.0 05/26/2022   No results found for: "PROLACTIN" Lab Results  Component Value Date   CHOL 198 05/26/2022   TRIG 71.0 05/26/2022   HDL 65.20 05/26/2022   CHOLHDL 3 05/26/2022   VLDL 14.2 05/26/2022   LDLCALC 119 (H) 05/26/2022   LDLCALC 133 (H) 11/21/2020   Lab Results  Component Value Date   TSH 3.18 05/26/2022   TSH 2.91 11/21/2020    Therapeutic Level Labs: No results found for: "LITHIUM" No results found for: "VALPROATE" No results found for: "CBMZ"  Current Medications: Current Outpatient Medications  Medication Sig Dispense Refill   atorvastatin (LIPITOR) 20 MG tablet Take 1 tablet (20 mg total) by mouth daily. 90 tablet 3   escitalopram (LEXAPRO) 10 MG tablet Take 1  tablet (10 mg total) by mouth daily. 30 tablet 3   hydrOXYzine (ATARAX) 10 MG tablet Take 1 tablet (10 mg total) by mouth 3 (three) times daily as needed. 90 tablet 3   OLANZapine (ZYPREXA) 10 MG tablet Take 1 tablet (10 mg total) by mouth at bedtime. Take a half tablet (2.5 mg) for 2 nights then take a whole tablet (5 mg) at night 30 tablet 3   sildenafil (VIAGRA) 100 MG tablet TAKE ONE-HALF TO ONE (0.5-1) TABLET BY MOUTH DAILY AS NEEDED FOR ERECTILE DYSFUNCTION (Patient not taking: Reported on 05/26/2022) 5 tablet 11   valACYclovir (VALTREX) 1000 MG tablet Take 1 tablet (1,000 mg  total) by mouth 2 (two) times daily. (Patient not taking: Reported on 11/21/2020) 14 tablet 3   No current facility-administered medications for this visit.     Musculoskeletal: Strength & Muscle Tone: {desc; muscle tone:32375} Gait & Station: {PE GAIT ED QX:8161427 Patient leans: {Patient Leans:21022755}  Psychiatric Specialty Exam: Review of Systems  There were no vitals taken for this visit.There is no height or weight on file to calculate BMI.  General Appearance: {Appearance:22683}  Eye Contact:  {BHH EYE CONTACT:22684}  Speech:  {Speech:22685}  Volume:  {Volume (PAA):22686}  Mood:  {BHH MOOD:22306}  Affect:  {Affect (PAA):22687}  Thought Process:  {Thought Process (PAA):22688}  Orientation:  {BHH ORIENTATION (PAA):22689}  Thought Content: {Thought Content:22690}   Suicidal Thoughts:  {ST/HT (PAA):22692}  Homicidal Thoughts:  {ST/HT (PAA):22692}  Memory:  {BHH MEMORY:22881}  Judgement:  {Judgement (PAA):22694}  Insight:  {Insight (PAA):22695}  Psychomotor Activity:  {Psychomotor (PAA):22696}  Concentration:  {Concentration:21399}  Recall:  {BHH GOOD/FAIR/POOR:22877}  Fund of Knowledge: {BHH GOOD/FAIR/POOR:22877}  Language: {BHH GOOD/FAIR/POOR:22877}  Akathisia:  {BHH YES OR NO:22294}  Handed:  Right  AIMS (if indicated): {Desc; done/not:10129}  Assets:  {Assets (PAA):22698}  ADL's:  {BHH TW:9249394  Cognition: {chl bhh cognition:304700322}  Sleep:  {BHH GOOD/FAIR/POOR:22877}   Screenings: GAD-7    Flowsheet Row Clinical Support from 09/07/2022 in [facility] Office Visit from 06/25/2022 in [facility] Counselor from 04/08/2022 in [facility]  Total GAD-7 Score 20 17 18      $ PHQ2-9    Flowsheet Row Clinical Support from 09/07/2022 in [facility] Office Visit from 06/25/2022 in [facility] Office Visit from 05/26/2022 in [place] [facility] from 04/08/2022 in [facility] Office Visit from 11/21/2020 in [place] [facility]  PHQ-2 Total Score 5 3 0 6 0  PHQ-9 Total Score 23 16 2 21 $ --      Flowsheet Row Clinical Support from 09/07/2022 in [facility] Counselor from 04/08/2022 in [place] Error: Q7 should not be populated when Q6 is No No Risk        Assessment and Plan:  CHASE ARRENDALE is a 52 yr old male who presents for Follow Up and Medication Management.  PPHx is significant for Schizoaffective Disorder, Bipolar Type, Depression, Anxiety, ECT in 2014, one Suicide Attempt (age 20 cut wrists) and multiple "cry for attention" attempts, and multiple hospitalizations (latest [facility] 2012), and no history of Self Injurious Behavior.     ***   Schizoaffective Disorder, Bipolar Type  MDD : -Continue Zyprexa 10 mg QHS for psychosis, mood stability, augmentation.  30 tablets with 2 refills. -Continue Lexapro 10 mg  daily for depression and anxiety. 30 tablets with 2 refills. -Continue Hydroxyzine 10 mg TID PRN.  90 tablets with 2 refills.    Collaboration of Care: Collaboration of Care: {BH OP Collaboration of Care:21014065}  Patient/Guardian was advised Release of Information must be obtained prior to any record release in order to collaborate their care with an outside provider. Patient/Guardian was advised if they have not already done so to contact the registration department to sign all necessary forms in order for [place] to release information regarding their care.   Consent: Patient/Guardian gives verbal consent for treatment and assignment of benefits for services provided during this visit. Patient/Guardian expressed understanding and agreed to proceed.    Briant Cedar, MD 12/11/2022, 7:02 AM

## 2023-12-22 ENCOUNTER — Telehealth: Payer: 59 | Admitting: Physician Assistant

## 2023-12-22 DIAGNOSIS — R350 Frequency of micturition: Secondary | ICD-10-CM

## 2023-12-22 NOTE — Progress Notes (Signed)
 E-Visit for Urinary Problems ? ?Based on what you shared with me, I feel your condition warrants further evaluation and I recommend that you be seen for a face to face office visit.  Male bladder infections are not very common.  We worry about prostate or kidney conditions.  The standard of care is to examine the abdomen and kidneys, and to do a urine and blood test to make sure that something more serious is not going on.  We recommend that you see a provider today.  If your doctor's office is closed [place] has the following Urgent Cares: ? ?  ?NOTE: You will not be charged for this e-visit. ? ?If you are having a true medical emergency please call 911.   ? ?  ? For an urgent face to face visit, [place] has six urgent care centers for your convenience:  ?  ? [facility] at [facility] ?Get Driving Directions ?[phone] ?[phone] ?[place], [place] [address] ?  ? [facility]) ?Get Driving Directions ?[phone] ?[address] ?[place], [place] [address] ? ?[facility]) ?Get Driving Directions ?[phone] ?[address] ?[place],  [place]  [address] ? ?[facility] ?Get Driving Directions ?[phone] ?[address] ?[place], [place] [address] ?  ?[facility] [place] ?Get Driving Directions  ?[phone] ?[address] ?[address] ?[place], [place] [address] ?  ? Urgent Care at [facility] ?Get Driving Directions ?[phone] ?[address] ?[place], [place] [address] ? ?Your MyChart E-visit questionnaire answers were reviewed by a board certified advanced clinical practitioner to complete your personal care plan based on your specific symptoms.  Thank you for using e-Visits. ?

## 2023-12-23 ENCOUNTER — Ambulatory Visit (INDEPENDENT_AMBULATORY_CARE_PROVIDER_SITE_OTHER): Payer: No Typology Code available for payment source | Admitting: Internal Medicine

## 2023-12-23 ENCOUNTER — Encounter: Payer: Self-pay | Admitting: Internal Medicine

## 2023-12-23 VITALS — BP 120/68 | HR 53 | Temp 98.2°F | Ht 71.0 in | Wt 219.0 lb

## 2023-12-23 DIAGNOSIS — R3915 Urgency of urination: Secondary | ICD-10-CM

## 2023-12-23 DIAGNOSIS — N529 Male erectile dysfunction, unspecified: Secondary | ICD-10-CM | POA: Diagnosis not present

## 2023-12-23 DIAGNOSIS — E7800 Pure hypercholesterolemia, unspecified: Secondary | ICD-10-CM

## 2023-12-23 DIAGNOSIS — E538 Deficiency of other specified B group vitamins: Secondary | ICD-10-CM | POA: Diagnosis not present

## 2023-12-23 DIAGNOSIS — Z0000 Encounter for general adult medical examination without abnormal findings: Secondary | ICD-10-CM | POA: Diagnosis not present

## 2023-12-23 DIAGNOSIS — R350 Frequency of micturition: Secondary | ICD-10-CM

## 2023-12-23 DIAGNOSIS — R7309 Other abnormal glucose: Secondary | ICD-10-CM

## 2023-12-23 DIAGNOSIS — Z125 Encounter for screening for malignant neoplasm of prostate: Secondary | ICD-10-CM | POA: Diagnosis not present

## 2023-12-23 DIAGNOSIS — F250 Schizoaffective disorder, bipolar type: Secondary | ICD-10-CM

## 2023-12-23 DIAGNOSIS — E559 Vitamin D deficiency, unspecified: Secondary | ICD-10-CM

## 2023-12-23 DIAGNOSIS — Z1211 Encounter for screening for malignant neoplasm of colon: Secondary | ICD-10-CM

## 2023-12-23 DIAGNOSIS — Z0001 Encounter for general adult medical examination with abnormal findings: Secondary | ICD-10-CM

## 2023-12-23 LAB — URINALYSIS, ROUTINE W REFLEX MICROSCOPIC
Bilirubin Urine: NEGATIVE
Hgb urine dipstick: NEGATIVE
Ketones, ur: NEGATIVE
Leukocytes,Ua: NEGATIVE
Nitrite: NEGATIVE
RBC / HPF: NONE SEEN (ref 0–?)
Specific Gravity, Urine: 1.02 (ref 1.000–1.030)
Total Protein, Urine: NEGATIVE
Urine Glucose: NEGATIVE
Urobilinogen, UA: 0.2 (ref 0.0–1.0)
WBC, UA: NONE SEEN (ref 0–?)
pH: 6.5 (ref 5.0–8.0)

## 2023-12-23 LAB — CBC WITH DIFFERENTIAL/PLATELET
Basophils Absolute: 0 K/uL (ref 0.0–0.1)
Basophils Relative: 0.5 % (ref 0.0–3.0)
Eosinophils Absolute: 0.1 K/uL (ref 0.0–0.7)
Eosinophils Relative: 3.7 % (ref 0.0–5.0)
HCT: 38.8 % — ABNORMAL LOW (ref 39.0–52.0)
Hemoglobin: 12.2 g/dL — ABNORMAL LOW (ref 13.0–17.0)
Lymphocytes Relative: 35.1 % (ref 12.0–46.0)
Lymphs Abs: 1.3 K/uL (ref 0.7–4.0)
MCHC: 31.5 g/dL (ref 30.0–36.0)
MCV: 71.6 fl — ABNORMAL LOW (ref 78.0–100.0)
Monocytes Absolute: 0.3 K/uL (ref 0.1–1.0)
Monocytes Relative: 6.7 % (ref 3.0–12.0)
Neutro Abs: 2 K/uL (ref 1.4–7.7)
Neutrophils Relative %: 54 % (ref 43.0–77.0)
Platelets: 180 K/uL (ref 150.0–400.0)
RBC: 5.42 Mil/uL (ref 4.22–5.81)
RDW: 15.6 % — ABNORMAL HIGH (ref 11.5–15.5)
WBC: 3.8 K/uL — ABNORMAL LOW (ref 4.0–10.5)

## 2023-12-23 LAB — VITAMIN D 25 HYDROXY (VIT D DEFICIENCY, FRACTURES): VITD: 37.4 ng/mL (ref 30.00–100.00)

## 2023-12-23 LAB — HEPATIC FUNCTION PANEL
ALT: 23 U/L (ref 0–53)
AST: 35 U/L (ref 0–37)
Albumin: 4.2 g/dL (ref 3.5–5.2)
Alkaline Phosphatase: 44 U/L (ref 39–117)
Bilirubin, Direct: 0.1 mg/dL (ref 0.0–0.3)
Total Bilirubin: 0.9 mg/dL (ref 0.2–1.2)
Total Protein: 7.2 g/dL (ref 6.0–8.3)

## 2023-12-23 LAB — BASIC METABOLIC PANEL
BUN: 8 mg/dL (ref 6–23)
CO2: 28 meq/L (ref 19–32)
Calcium: 9.1 mg/dL (ref 8.4–10.5)
Chloride: 104 meq/L (ref 96–112)
Creatinine, Ser: 1.11 mg/dL (ref 0.40–1.50)
GFR: 77.14 mL/min (ref >60.00)
Glucose, Bld: 101 mg/dL — ABNORMAL HIGH (ref 70–99)
Potassium: 4 meq/L (ref 3.5–5.1)
Sodium: 140 meq/L (ref 135–145)

## 2023-12-23 LAB — PSA: PSA: 0.86 ng/mL (ref 0.10–4.00)

## 2023-12-23 LAB — LIPID PANEL
Cholesterol: 221 mg/dL — ABNORMAL HIGH (ref 0–200)
HDL: 69.2 mg/dL (ref >39.00)
LDL Cholesterol: 138 mg/dL — ABNORMAL HIGH (ref 0–99)
NonHDL: 151.78
Total CHOL/HDL Ratio: 3
Triglycerides: 67 mg/dL (ref 0.0–149.0)
VLDL: 13.4 mg/dL (ref 0.0–40.0)

## 2023-12-23 LAB — TSH: TSH: 3.05 u[IU]/mL (ref 0.35–5.50)

## 2023-12-23 LAB — VITAMIN B12: Vitamin B-12: 326 pg/mL (ref 211–911)

## 2023-12-23 LAB — HEMOGLOBIN A1C: Hgb A1c MFr Bld: 6 % (ref 4.6–6.5)

## 2023-12-23 MED ORDER — TADALAFIL 20 MG PO TABS: 20.0000 mg | ORAL_TABLET | Freq: Every day | ORAL | 11 refills | Status: AC | PRN

## 2023-12-23 MED ORDER — ATORVASTATIN CALCIUM 20 MG PO TABS: 20.0000 mg | ORAL_TABLET | Freq: Every day | ORAL | 3 refills | Status: AC

## 2023-12-23 NOTE — Assessment & Plan Note (Addendum)
Age and sex appropriate education and counseling updated with regular exercise and diet Referrals for preventative services -for colonoscopy Immunizations addressed - for tdap and shingrix at pharmacy Smoking counseling  - none needed Evidence for depression or other mood disorder - mild symptomatic, declines need for tx or psychiatry for now Most recent labs reviewed. I have personally reviewed and have noted: 1) the patient's medical and social history 2) The patient's current medications and supplements 3) The patient's height, weight, and BMI have been recorded in the chart

## 2023-12-23 NOTE — Assessment & Plan Note (Signed)
Mild symptomatic, declines need for further tx or psychiatry for now, continue counseling

## 2023-12-23 NOTE — Assessment & Plan Note (Signed)
Etiology unclear, for urology referral, UA today

## 2023-12-23 NOTE — Assessment & Plan Note (Signed)
Lab Results  Component Value Date   HGBA1C 6.0 05/26/2022   Stable, pt to continue current medical treatment  - diet, wt control

## 2023-12-23 NOTE — Assessment & Plan Note (Signed)
Worsening, for cialis prn

## 2023-12-23 NOTE — Patient Instructions (Signed)
Please have the Tdap (tetanus) and Shingrix shots at the pharmacy  Please continue all other medications as before, and refills have been done including the Cialis and lipitor  Please have the pharmacy call with any other refills you may need.  Please continue your efforts at being more active, low cholesterol diet, and weight control.  You are otherwise up to date with prevention measures today.  Please keep your appointments with your specialists as you may have planned  You will be contacted regarding the referral for: Urology  Please go to the LAB at the blood drawing area for the tests to be done  You will be contacted by phone if any changes need to be made immediately.  Otherwise, you will receive a letter about your results with an explanation, but please check with MyChart first.  Please make an Appointment to return for your 1 year visit, or sooner if needed

## 2023-12-23 NOTE — Progress Notes (Signed)
Patient ID: Andre Morgan, male   DOB: 05-22-1971, 53 y.o.   MRN: [id number]         Chief Complaint:: wellness exam and urinary symptoms, hld, ED, schizoaffective disorder       HPI:  Andre Morgan is a 54 y.o. male here for wellness exam; for tdap and shingrix at pharmacy, for colonoscopy, o/w up to date                        Also Denies urinary symptoms such as dysuria, flank pain, hematuria or n/v, fever, chills but has 2 yrs at least of urinary frequency, urgency and occasional leakage, asking for urology referral.  Pt denies chest pain, increased sob or doe, wheezing, orthopnea, PND, increased LE swelling, palpitations, dizziness or syncope.   Pt denies polydipsia, polyuria, or new focal neuro s/s.    Pt denies fever, wt loss, night sweats, loss of appetite, or other constitutional symptoms  Has had mild worsening depressive symptoms, but no suicidal ideation, or panic; declines meds for now or psychiatry follow up for now, has a regular counselor and church friends.  Has had worsening ED symptoms in the last 6 mo.     Wt Readings from Last 3 Encounters:  12/23/23 219 lb (99.3 kg)  05/26/22 194 lb 6.4 oz (88.2 kg)  11/21/20 207 lb (93.9 kg)   BP Readings from Last 3 Encounters:  12/23/23 120/68  05/26/22 118/66  11/21/20 120/72   Immunization History  Administered Date(s) Administered   Tdap 12/03/2010   Health Maintenance Due  Topic Date Due   Colonoscopy  Never done   DTaP/Tdap/Td (2 - Td or Tdap) 12/03/2020   Zoster Vaccines- Shingrix (1 of 2) Never done      Past Medical History:  Diagnosis Date   Alcohol abuse 12/05/2011   Bipolar disorder (HCC) 12/05/2011   Depression    Erectile dysfunction 12/09/2011   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  IRRIGATION AND DEBRIDEMENT WITH NAIL REMOVAL AND REPAIR OF NAIL BED.;  Surgeon: Terance Hart, MD;  Location: [facility] OR;  Service: Orthopedics;  Laterality: Left;   TENDON REPAIR      reports that he quit smoking about 3 years ago. His smoking use included pipe. He has never used smokeless tobacco. He reports that he does not currently use drugs after having used the following drugs: Marijuana. He reports that he does not drink alcohol. family history is not on file. No Known Allergies No current outpatient medications on file prior to visit.   No current facility-administered medications on file prior to visit.        ROS:  All others reviewed and negative.  Objective        PE:  BP 120/68 (BP Location: Right Arm, Patient Position: Sitting, Cuff Size: Normal)   Pulse (!) 53   Temp 98.2 F (36.8 C) (Oral)   Ht 5\' 11"  (1.803 m)   Wt 219 lb (99.3 kg)   SpO2 99%   BMI 30.54 kg/m                 Constitutional: Pt appears in NAD               HENT: Head: NCAT.  Right Ear: External ear normal.                 Left Ear: External ear normal.                Eyes: . Pupils are equal, round, and reactive to light. Conjunctivae and EOM are normal               Nose: without d/c or deformity               Neck: Neck supple. Gross normal ROM               Cardiovascular: Normal rate and regular rhythm.                 Pulmonary/Chest: Effort normal and breath sounds without rales or wheezing.                Abd:  Soft, NT, ND, + BS, no organomegaly               Neurological: Pt is alert. At baseline orientation, motor grossly intact               Skin: Skin is warm. No rashes, no other new lesions, LE edema - none               Psychiatric: Pt behavior is normal without agitation , mild depressed  Micro: none  Cardiac tracings I have personally interpreted today:  none  Pertinent Radiological findings (summarize): none   Lab Results  Component Value Date   WBC 3.9  (L) 05/26/2022   HGB 11.7 (L) 05/26/2022   HCT 36.6 (L) 05/26/2022   PLT 163.0 05/26/2022   GLUCOSE 88 05/26/2022   CHOL 198 05/26/2022   TRIG 71.0 05/26/2022   HDL 65.20 05/26/2022   LDLCALC 119 (H) 05/26/2022   ALT 25 05/26/2022   AST 40 (H) 05/26/2022   NA 140 05/26/2022   K 4.0 05/26/2022   CL 103 05/26/2022   CREATININE 1.13 05/26/2022   BUN 11 05/26/2022   CO2 30 05/26/2022   TSH 3.18 05/26/2022   PSA 0.55 05/26/2022   HGBA1C 6.0 05/26/2022   Assessment/Plan:  Andre Morgan is a 53 y.o. Black or African American [2] male with  has a past medical history of Alcohol abuse (12/05/2011), Bipolar disorder (HCC) (12/05/2011), Depression, Erectile dysfunction (12/09/2011), History of positive PPD (12/08/2011), and Recurrent cold sores (12/09/2011).  Encounter for well adult exam with abnormal findings Age and sex appropriate education and counseling updated with regular exercise and diet Referrals for preventative services -for colonoscopy Immunizations addressed - for tdap and shingrix at pharmacy Smoking counseling  - none needed Evidence for depression or other mood disorder - mild symptomatic, declines need for tx or psychiatry for now Most recent labs reviewed. I have personally reviewed and have noted: 1) the patient's medical and social history 2) The patient's current medications and supplements 3) The patient's height, weight, and BMI have been recorded in the chart   Urinary frequency Etiology unclear, for urology referral, UA today  HLD (hyperlipidemia) Lab Results  Component Value Date   LDLCALC 119 (H) 05/26/2022   uncontrolled, pt to restart statin lipitor 20 qd  Elevated glucose Lab Results  Component Value Date   HGBA1C 6.0 05/26/2022   Stable, pt to continue current medical treatment  - diet, wt control   Erectile dysfunction Worsening, for cialis prn  Schizoaffective disorder, bipolar type Mild symptomatic, declines need  for further tx or  psychiatry for now, continue counseling  Followup: Return in about 1 year (around 12/22/2024).  Oliver Barre, MD 12/23/2023 8:33 PM [facility]

## 2023-12-23 NOTE — Assessment & Plan Note (Signed)
Lab Results  Component Value Date   LDLCALC 119 (H) 05/26/2022   uncontrolled, pt to restart statin lipitor 20 qd

## 2023-12-24 ENCOUNTER — Encounter: Payer: Self-pay | Admitting: Internal Medicine

## 2023-12-24 ENCOUNTER — Telehealth: Payer: Self-pay | Admitting: Internal Medicine

## 2023-12-24 MED ORDER — SILDENAFIL CITRATE 100 MG PO TABS: 50.0000 mg | ORAL_TABLET | Freq: Every day | ORAL | 11 refills | Status: DC | PRN

## 2023-12-24 NOTE — Telephone Encounter (Signed)
Copied from CRM [id number]. Topic: Clinical - Prescription Issue >> Dec 24, 2023 10:10 AM Andre Morgan wrote: Reason for CRM: pt called to speak with provider regarding medication. Sildenafil 10 mg. Pt states he prefers to have Sildenafil 10 mg instead of cialis. Pt is requesting the provider change the prescription back to Sildenafil. Please call pt back at [phone]

## 2023-12-24 NOTE — Telephone Encounter (Signed)
 Ok this is done

## 2023-12-24 NOTE — Progress Notes (Signed)
The test results show that your current treatment is OK, as the tests are stable, except the LDL cholesterol is mildly elevated.   Please follow a lower cholesterol diet, and let me know if you would be ok to start a medication called crestor 10 mg per day to help this as well.  Otherwise.  Please continue the same plan.  There is no other need for change of treatment or further evaluation based on these results, at this time.  thanks

## 2023-12-24 NOTE — Telephone Encounter (Signed)
 See below

## 2023-12-28 ENCOUNTER — Telehealth: Payer: Self-pay | Admitting: Internal Medicine

## 2023-12-28 NOTE — Telephone Encounter (Signed)
 Copied from CRM [id number]. Topic: General - Other >> Dec 28, 2023 10:55 AM [place] A wrote: Reason for CRM: Selena Batten from [facility] called and the insurance is out of network for patient. She wants to know if she should call patient -please call her [phone] ext [phone]

## 2024-01-26 ENCOUNTER — Telehealth: Payer: Self-pay | Admitting: Internal Medicine

## 2024-01-26 DIAGNOSIS — Z1211 Encounter for screening for malignant neoplasm of colon: Secondary | ICD-10-CM

## 2024-01-26 NOTE — Telephone Encounter (Signed)
 Copied from CRM [id number]. Topic: Referral - Request for Referral >> Jan 26, 2024  9:05 AM Aletta Edouard wrote: Did the patient discuss referral with their provider in the last year? No (If No - schedule appointment) (If Yes - send message)  Appointment offered? No  Type of order/referral and detailed reason for visit: urologist  Preference of office, provider, location: unknown   If referral order, have you been seen by this specialty before? No (If Yes, this issue or another issue? When? Where?  Can we respond through MyChart? No

## 2024-01-26 NOTE — Telephone Encounter (Signed)
 Ok this is done

## 2024-01-26 NOTE — Telephone Encounter (Unsigned)
 Copied from CRM [id number]. Topic: Referral - Request for Referral >> Jan 26, 2024  9:03 AM Aletta Edouard wrote: Did the patient discuss referral with their provider in the last year? Yes (If No - schedule appointment) (If Yes - send message)  Appointment offered? No  Type of order/referral and detailed reason for visit: colonoscopy   Preference of office, provider, location: unknown whoever accepts his health insurance   If referral order, have you been seen by this specialty before? No (If Yes, this issue or another issue? When? Where?  Can we respond through MyChart? No

## 2024-01-26 NOTE — Telephone Encounter (Signed)
 Needs OV unless pt has a specific reason for the Urology referral   thanks

## 2024-02-08 ENCOUNTER — Encounter: Payer: Self-pay | Admitting: Gastroenterology

## 2024-03-02 ENCOUNTER — Ambulatory Visit (AMBULATORY_SURGERY_CENTER): Admitting: *Deleted

## 2024-03-02 ENCOUNTER — Telehealth: Payer: Self-pay | Admitting: *Deleted

## 2024-03-02 VITALS — Ht 71.0 in | Wt 200.0 lb

## 2024-03-02 DIAGNOSIS — Z1211 Encounter for screening for malignant neoplasm of colon: Secondary | ICD-10-CM

## 2024-03-02 MED ORDER — NA SULFATE-K SULFATE-MG SULF 17.5-3.13-1.6 GM/177ML PO SOLN
1.0000 | Freq: Once | ORAL | 0 refills | Status: AC
Start: 2024-03-02 — End: 2024-03-02

## 2024-03-02 NOTE — Progress Notes (Signed)
 Pt's name and DOB verified at the beginning of the pre-visit wit 2 identifiers  Pt denies any difficulty with ambulating,sitting, laying down or rolling side to side  Pt has no issues with ambulation   Pt has no issues moving head neck or swallowing  No egg or soy allergy known to patient   No issues known to pt with past sedation with any surgeries or procedures  Pt denies having issues being intubated  No FH of Malignant Hyperthermia  Pt is not on diet pills or shots  Pt is not on home 02   Pt is not on blood thinners   Pt denies issues with constipation   Pt is not on dialysis  Pt denise any abnormal heart rhythms   Pt denies any upcoming cardiac testing  Patient's chart reviewed by Cathlyn Parsons CNRA prior to pre-visit and patient appropriate for the [facility].  Pre-visit completed and red dot placed by patient's name on their procedure day (on provider's schedule).    Visit by phone  Pt states weight is 200 lb  IInstructions reviewed. Pt given , [facility] main # and MD on call # prior to instructions.  Pt states understanding of instructions. Instructed to review again prior to procedure. Pt states they will.

## 2024-03-02 NOTE — Telephone Encounter (Signed)
 Attempt to reach pt for pre-visit. LM with call back #  Will attempt to reach again in 5 min due to no other # listed in profile  Pt called back

## 2024-03-09 ENCOUNTER — Encounter: Payer: Self-pay | Admitting: Gastroenterology

## 2024-03-23 ENCOUNTER — Encounter: Payer: Self-pay | Admitting: Gastroenterology

## 2024-03-23 ENCOUNTER — Ambulatory Visit (AMBULATORY_SURGERY_CENTER): Admitting: Gastroenterology

## 2024-03-23 VITALS — BP 114/55 | HR 47 | Temp 97.2°F | Resp 13 | Ht 71.0 in | Wt 200.0 lb

## 2024-03-23 DIAGNOSIS — K562 Volvulus: Secondary | ICD-10-CM | POA: Diagnosis not present

## 2024-03-23 DIAGNOSIS — Z1211 Encounter for screening for malignant neoplasm of colon: Secondary | ICD-10-CM | POA: Diagnosis present

## 2024-03-23 DIAGNOSIS — K648 Other hemorrhoids: Secondary | ICD-10-CM | POA: Diagnosis not present

## 2024-03-23 DIAGNOSIS — K640 First degree hemorrhoids: Secondary | ICD-10-CM

## 2024-03-23 NOTE — Progress Notes (Signed)
 Pt's states no medical or surgical changes since previsit or office visit.

## 2024-03-23 NOTE — Op Note (Signed)
 [facility] Patient Name: Andre Morgan Procedure Date: 03/23/2024 9:53 AM MRN: [id number] Endoscopist: Harry Lindau , MD, [id number] Age: 53 Referring MD:  Date of Birth: 01-21-1971 Gender: Male Account #: [id number] Procedure:                Colonoscopy Indications:              Screening for colorectal malignant neoplasm, This                            is the patient's first colonoscopy Medicines:                Monitored Anesthesia Care Procedure:                Pre-Anesthesia Assessment:                           - Prior to the procedure, a History and Physical                            was performed, and patient medications and                            allergies were reviewed. The patient's tolerance of                            previous anesthesia was also reviewed. The risks                            and benefits of the procedure and the sedation                            options and risks were discussed with the patient.                            All questions were answered, and informed consent                            was obtained. Prior Anticoagulants: The patient has                            taken no anticoagulant or antiplatelet agents. ASA                            Grade Assessment: II - A patient with mild systemic                            disease. After reviewing the risks and benefits,                            the patient was deemed in satisfactory condition to                            undergo the procedure.  After obtaining informed consent, the colonoscope                            was passed under direct vision. Throughout the                            procedure, the patient's blood pressure, pulse, and                            oxygen saturations were monitored continuously. The                            CF [id number] #[id number] was introduced through the anus                            and advanced to the the  cecum, identified by                            appendiceal orifice and ileocecal valve. The                            colonoscopy was performed without difficulty. The                            patient tolerated the procedure well. The quality                            of the bowel preparation was good. The ileocecal                            valve, appendiceal orifice, and rectum were                            photographed. Scope In: 10:17:10 AM Scope Out: 10:36:00 AM Scope Withdrawal Time: 0 hours 10 minutes 34 seconds  Total Procedure Duration: 0 hours 18 minutes 50 seconds  Findings:                 The perianal and digital rectal examinations were                            normal.                           The ascending colon revealed significantly                            excessive looping. Advancing the scope required                            using manual pressure and straightening and                            shortening the scope to obtain bowel loop reduction.  The entire colon otherwise appeared normal.                           Non-bleeding internal hemorrhoids were found during                            retroflexion. The hemorrhoids were small. Complications:            No immediate complications. Estimated Blood Loss:     Estimated blood loss: none. Impression:               - There was significant looping of the colon.                           - The entire examined colon is normal.                           - Non-bleeding internal hemorrhoids.                           - No specimens collected. Recommendation:           - Patient has a contact number available for                            emergencies. The signs and symptoms of potential                            delayed complications were discussed with the                            patient. Return to normal activities tomorrow.                            Written discharge  instructions were provided to the                            patient.                           - Resume previous diet.                           - Continue present medications.                           - Repeat colonoscopy in 10 years for screening                            purposes.                           - Return to GI clinic PRN. Harry Lindau, MD 03/23/2024 10:43:40 AM

## 2024-03-23 NOTE — Progress Notes (Signed)
 GASTROENTEROLOGY PROCEDURE H&P NOTE   Primary Care Physician: Roslyn Coombe, MD    Reason for Procedure:  Colon Cancer screening  Plan:    Colonoscopy  Patient is appropriate for endoscopic procedure(s) in the ambulatory ([facility]) setting.  The nature of the procedure, as well as the risks, benefits, and alternatives were carefully and thoroughly reviewed with the patient. Ample time for discussion and questions allowed. The patient understood, was satisfied, and agreed to proceed.     HPI: Andre Morgan is a 53 y.o. male who presents for colonoscopy for routine Colon Cancer screening.  No active GI symptoms.  No known family history of colon cancer or related malignancy.  Patient is otherwise without complaints or active issues today.  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 IRRIGATION AND DEBRIDEMENT WITH NAIL REMOVAL AND REPAIR OF NAIL BED.;  Surgeon: Donnamarie Gables, MD;  Location: [facility] OR;  Service: Orthopedics;  Laterality: Left;   TENDON REPAIR      Prior to Admission medications   Medication Sig Start Date End Date Taking? Authorizing Provider  tadalafil  (CIALIS ) 20 MG tablet Take 1 tablet (20 mg total) by mouth daily as needed for erectile dysfunction. 12/23/23  Yes Roslyn Coombe, MD  atorvastatin  (LIPITOR) 20 MG tablet Take 1 tablet (20 mg total) by mouth daily. Patient not taking: Reported on 03/23/2024 12/23/23 12/22/24  Roslyn Coombe, MD    Current Outpatient Medications  Medication Sig Dispense Refill   tadalafil  (CIALIS ) 20 MG tablet Take 1 tablet (20 mg total) by mouth daily as needed for  erectile dysfunction. 10 tablet 11   atorvastatin  (LIPITOR) 20 MG tablet Take 1 tablet (20 mg total) by mouth daily. (Patient not taking: Reported on 03/23/2024) 90 tablet 3   No current facility-administered medications for this visit.    Allergies as of 03/23/2024   (No Known Allergies)    Family History  Problem Relation Age of Onset   Colon polyps Neg Hx    Colon cancer Neg Hx    Esophageal cancer Neg Hx    Rectal cancer Neg Hx    Stomach cancer Neg Hx     Social History   Socioeconomic History   Marital status: Married    Spouse name: Not on file   Number of children: Not on file   Years of education: 13   Highest education level: Some college, no degree  Occupational History   Occupation: emt    Employer: [employer]  Tobacco Use   Smoking status: Former    Types: Pipe    Quit date: 05/02/2020    Years since quitting: 3.8   Smokeless tobacco: Never  Vaping Use   Vaping status: Never Used  Substance and Sexual Activity   Alcohol use: Yes    Comment: occ   Drug use: Not Currently    Types: Marijuana   Sexual activity: Not on file  Other Topics Concern   Not on file  Social History Narrative   Not on file   Social Drivers of Health   Financial Resource Strain: Medium Risk (12/22/2023)   Overall  Financial Resource Strain (CARDIA)    Difficulty of Paying Living Expenses: Somewhat hard  Food Insecurity: Food Insecurity Present (12/22/2023)   Hunger Vital Sign    Worried About Running Out of Food in the Last Year: Often true    Ran Out of Food in the Last Year: Sometimes true  Transportation Needs: No Transportation Needs (12/22/2023)   PRAPARE - Administrator, Civil Service (Medical): No    Lack of Transportation (Non-Medical): No  Physical Activity: Unknown (12/22/2023)   Exercise Vital Sign    Days of Exercise per Week: 3 days    Minutes of Exercise per Session: Not on file  Stress: Stress Concern Present (12/22/2023)   [employer] of  Occupational Health - Occupational Stress Questionnaire    Feeling of Stress : Rather much  Social Connections: Moderately Integrated (12/22/2023)   Social Connection and Isolation Panel [NHANES]    Frequency of Communication with Friends and Family: Twice a week    Frequency of Social Gatherings with Friends and Family: Twice a week    Attends Religious Services: More than 4 times per year    Active Member of [employer] or Organizations: Yes    Attends Engineer, structural: More than 4 times per year    Marital Status: Separated  Intimate Partner Violence: Not on file    Physical Exam: Vital signs in last 24 hours: @BP  (!) 117/59   Pulse (!) 54   Temp (!) 97.2 F (36.2 C)   Resp 13   Ht 5\' 11"  (1.803 m)   Wt 200 lb (90.7 kg)   SpO2 100%   BMI 27.89 kg/m  GEN: NAD EYE: Sclerae anicteric ENT: MMM CV: Non-tachycardic Pulm: CTA b/l GI: Soft, NT/ND NEURO:  Alert & Oriented x 3   Harry Lindau, DO [facility]   03/23/2024 10:09 AM

## 2024-03-23 NOTE — Patient Instructions (Signed)

## 2024-03-23 NOTE — Progress Notes (Signed)
 To pacu, VSS. Report to [email or web address]

## 2024-03-24 ENCOUNTER — Telehealth: Payer: Self-pay

## 2024-03-24 NOTE — Telephone Encounter (Signed)
  Follow up Call-     03/23/2024    9:51 AM  Call back number  Post procedure Call Back phone  # [phone]  Permission to leave phone message Yes    Attempted to call patient regarding follow-up. No answer, VM left.

## 2024-11-08 ENCOUNTER — Ambulatory Visit (INDEPENDENT_AMBULATORY_CARE_PROVIDER_SITE_OTHER)

## 2024-11-08 ENCOUNTER — Encounter: Payer: Self-pay | Admitting: Podiatry

## 2024-11-08 ENCOUNTER — Ambulatory Visit: Admitting: Podiatry

## 2024-11-08 VITALS — Ht 71.0 in | Wt 200.0 lb

## 2024-11-08 DIAGNOSIS — M722 Plantar fascial fibromatosis: Secondary | ICD-10-CM | POA: Diagnosis not present

## 2024-11-08 DIAGNOSIS — M7752 Other enthesopathy of left foot: Secondary | ICD-10-CM

## 2024-11-08 MED ORDER — METHYLPREDNISOLONE 4 MG PO TBPK: ORAL_TABLET | ORAL | 0 refills | Status: AC

## 2024-11-08 MED ORDER — MELOXICAM 15 MG PO TABS: 15.0000 mg | ORAL_TABLET | Freq: Every day | ORAL | 1 refills | Status: AC

## 2024-11-10 NOTE — Progress Notes (Signed)
" ° °  Chief Complaint  Patient presents with   Foot Pain    Pt is here due to left foot pain, states the pain has been going on for about 6 month, no injury, pain is at the heel and side of the foot, states no treatment prior to visit.    Subjective: 54 y.o. male presenting today as a new patient for evaluation of pain and tenderness associated with plantar fasciitis.   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  Dermatology: Skin is warm, dry and supple bilateral lower extremities. Negative for open lesions or macerations bilateral.   Vascular: Dorsalis Pedis and Posterior Tibial pulses palpable bilateral.  Capillary fill time is immediate to all digits.  Neurological: Grossly intact via light touch  Musculoskeletal: Tenderness to palpation to the plantar aspect of the left heel along the plantar fascia. All other joints range of motion within normal limits bilateral. Strength 5/5 in all groups bilateral.   Radiographic exam LT foot 07/11/2025: Normal osseous mineralization. Joint spaces preserved. No fracture/dislocation/boney destruction. No other soft tissue abnormalities or radiopaque foreign bodies.   Assessment: 1. Plantar fasciitis left foot  Plan of Care:  -Patient evaluated. Xrays reviewed.   -declined cortisone injection today -Rx for Medrol  Dose Pak placed -Rx for Meloxicam  15 mg daily after completion of Dosepak -Advised against going barefoot.  Recommend good supportive shoes and sneakers -OTC power step insoles were dispensed.  Wear daily -Instructed patient regarding therapies and modalities at home to alleviate symptoms.  -Return to clinic PRN   Thresa EMERSON Sar, DPM [facility]  Dr. Thresa EMERSON Sar, DPM    [address] [place], [place] [address]                Office [phone]  Fax [phone]     "
